# Patient Record
Sex: Female | Born: 1970
Health system: Southern US, Community
[De-identification: ages and names within clinical notes are randomized; demographics above are authoritative.]

## PROBLEM LIST (undated history)

## (undated) DIAGNOSIS — K219 Gastro-esophageal reflux disease without esophagitis: Secondary | ICD-10-CM

## (undated) DIAGNOSIS — R112 Nausea with vomiting, unspecified: Secondary | ICD-10-CM

## (undated) HISTORY — DX: Gastro-esophageal reflux disease without esophagitis: K21.9

---
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
## 1999-01-07 ENCOUNTER — Other Ambulatory Visit: Admission: RE | Admit: 1999-01-07 | Discharge: 1999-01-07 | Payer: Self-pay | Admitting: *Deleted

## 2000-03-22 ENCOUNTER — Other Ambulatory Visit: Admission: RE | Admit: 2000-03-22 | Discharge: 2000-03-22 | Payer: Self-pay | Admitting: *Deleted

## 2001-05-08 ENCOUNTER — Other Ambulatory Visit: Admission: RE | Admit: 2001-05-08 | Discharge: 2001-05-08 | Payer: Self-pay | Admitting: *Deleted

## 2001-10-21 ENCOUNTER — Emergency Department (HOSPITAL_COMMUNITY): Admission: EM | Admit: 2001-10-21 | Discharge: 2001-10-21 | Payer: Self-pay | Admitting: Emergency Medicine

## 2002-05-10 ENCOUNTER — Other Ambulatory Visit: Admission: RE | Admit: 2002-05-10 | Discharge: 2002-05-10 | Payer: Self-pay | Admitting: *Deleted

## 2005-02-22 ENCOUNTER — Other Ambulatory Visit: Admission: RE | Admit: 2005-02-22 | Discharge: 2005-02-22 | Payer: Self-pay | Admitting: Obstetrics & Gynecology

## 2009-06-22 ENCOUNTER — Inpatient Hospital Stay (HOSPITAL_COMMUNITY): Admission: AD | Admit: 2009-06-22 | Discharge: 2009-06-25 | Payer: Self-pay | Admitting: Obstetrics and Gynecology

## 2009-06-23 ENCOUNTER — Encounter (INDEPENDENT_AMBULATORY_CARE_PROVIDER_SITE_OTHER): Payer: Self-pay | Admitting: Obstetrics and Gynecology

## 2010-11-18 LAB — CBC
HCT: 31.8 % — ABNORMAL LOW (ref 36.0–46.0)
Hemoglobin: 10.9 g/dL — ABNORMAL LOW (ref 12.0–15.0)
Hemoglobin: 9.5 g/dL — ABNORMAL LOW (ref 12.0–15.0)
MCHC: 33.6 g/dL (ref 30.0–36.0)
MCV: 99.4 fL (ref 78.0–100.0)
RBC: 2.84 MIL/uL — ABNORMAL LOW (ref 3.87–5.11)
RBC: 3.24 MIL/uL — ABNORMAL LOW (ref 3.87–5.11)
WBC: 13.7 10*3/uL — ABNORMAL HIGH (ref 4.0–10.5)
WBC: 14.7 10*3/uL — ABNORMAL HIGH (ref 4.0–10.5)

## 2011-10-27 ENCOUNTER — Other Ambulatory Visit (HOSPITAL_COMMUNITY): Payer: Self-pay | Admitting: Cardiology

## 2012-02-03 ENCOUNTER — Ambulatory Visit (INDEPENDENT_AMBULATORY_CARE_PROVIDER_SITE_OTHER): Payer: 59 | Admitting: Internal Medicine

## 2012-02-03 VITALS — BP 105/67 | HR 64 | Temp 98.5°F | Resp 16 | Ht 63.0 in | Wt 179.0 lb

## 2012-02-03 DIAGNOSIS — M545 Low back pain, unspecified: Secondary | ICD-10-CM

## 2012-02-03 DIAGNOSIS — M5126 Other intervertebral disc displacement, lumbar region: Secondary | ICD-10-CM

## 2012-02-03 DIAGNOSIS — IMO0002 Reserved for concepts with insufficient information to code with codable children: Secondary | ICD-10-CM

## 2012-02-03 MED ORDER — PREDNISONE 10 MG PO TABS: ORAL_TABLET | ORAL | Status: DC

## 2012-02-03 MED ORDER — HYDROCODONE-ACETAMINOPHEN 5-500 MG PO TABS: 1.0000 | ORAL_TABLET | Freq: Three times a day (TID) | ORAL | Status: AC | PRN

## 2012-02-03 MED ORDER — METHOCARBAMOL 750 MG PO TABS: 750.0000 mg | ORAL_TABLET | Freq: Four times a day (QID) | ORAL | Status: AC

## 2012-02-03 NOTE — Progress Notes (Signed)
  Subjective:    Patient ID: Gloria Torres, female    DOB: 02/14/1971, 41 y.o.   MRN: [id number]  HPI Healthy mother of a 2y/o. Lifted baby in  Tub and injured LB. Has R rad. Into buttock, no weakness, numbness.   Review of Systems neg    Objective:   Physical Exam  Constitutional: She is oriented to person, place, and time.  Musculoskeletal:       Lumbar back: She exhibits tenderness, pain and spasm. She exhibits normal range of motion, no swelling, no edema and no deformity.  Neurological: She is alert and oriented to person, place, and time. She has normal strength and normal reflexes. No cranial nerve deficit or sensory deficit. Gait normal.  Psychiatric: She has a normal mood and affect. Her speech is normal and behavior is normal. Judgment and thought content normal. Cognition and memory are normal.          Assessment & Plan:  LB strain with probable HNP R Back care and rest Meds

## 2012-02-03 NOTE — Patient Instructions (Addendum)

## 2012-02-19 ENCOUNTER — Ambulatory Visit (INDEPENDENT_AMBULATORY_CARE_PROVIDER_SITE_OTHER): Payer: 59 | Admitting: Family Medicine

## 2012-02-19 VITALS — BP 98/58 | HR 68 | Temp 98.4°F | Resp 16 | Ht 63.25 in | Wt 178.2 lb

## 2012-02-19 DIAGNOSIS — H6690 Otitis media, unspecified, unspecified ear: Secondary | ICD-10-CM

## 2012-02-19 MED ORDER — AMOXICILLIN-POT CLAVULANATE 875-125 MG PO TABS: 1.0000 | ORAL_TABLET | Freq: Two times a day (BID) | ORAL | Status: AC

## 2012-02-19 NOTE — Patient Instructions (Addendum)
Otitis Media with Effusion  Otitis media with effusion is the presence of fluid in the middle ear. This is a common problem that often follows ear infections. It may be present for weeks or longer after the infection. Unlike an acute ear infection, otits media with effusion refers only to fluid behind the ear drum and not infection. Children with repeated ear and sinus infections and allergy problems are the most likely to get otitis media with effusion.  CAUSES   The most frequent cause of the fluid buildup is dysfunction of the eustacian tubes. These are the tubes that drain fluid in the ears to the throat.  SYMPTOMS    The main symptom of this condition is hearing loss. As a result, you or your child may:   Listen to the TV at a loud volume.   Not respond to questions.   Ask "what" often when spoken to.   There may be a sensation of fullness or pressure but usually not pain.  DIAGNOSIS    Your caregiver will diagnose this condition by examining you or your child's ears.   Your caregiver may test the pressure in you or your child's ear with a tympanometer.   A hearing test may be conducted if the problem persists.   A caregiver will want to re-evaluate the condition periodically to see if it improves.  TREATMENT    Treatment depends on the duration and the effects of the effusion.   Antibiotics, decongestants, nose drops, and cortisone-type drugs may not be helpful.   Children with persistent ear effusions may have delayed language. Children at risk for developmental delays in hearing, learning, and speech may require referral to a specialist earlier than children not at risk.   You or your child's caregiver may suggest a referral to an Ear, Nose, and Throat (ENT) surgeon for treatment. The following may help restore normal hearing:   Drainage of fluid.   Placement of ear tubes (tympanostomy tubes).   Removal of adenoids (adenoidectomy).  HOME CARE INSTRUCTIONS    Avoid second hand  smoke.   Infants who are breast fed are less likely to have this condition.   Avoid feeding infants while laying flat.   Avoid known environmental allergens.   Be sure to see a caregiver or an ENT specialist for follow up.   Avoid people who are sick.  SEEK MEDICAL CARE IF:    Hearing is not better in 3 months.   Hearing is worse.   Ear pain.   Drainage from the ear.   Dizziness.  Document Released: 09/09/2004 Document Revised: 07/22/2011 Document Reviewed: 12/23/2009  [employer] Patient Information 2012 [employer].

## 2012-02-19 NOTE — Progress Notes (Signed)
@[email or web address] @   Patient ID: Gloria Torres MRN: [id number], DOB: 07/23/1971, 41 y.o. Date of Encounter: 02/19/2012, 10:26 AM  Primary Physician: Tally Due, MD  Chief Complaint:  Chief Complaint  Patient presents with  . Otalgia    left ear started hurting last night and today started to drain    HPI: 41 y.o. year old female presents with 1 day history of otalgia. Symptoms began with nasal congestion, sore throat, and cough. Afebrile. Nasal congestion thick and green/yellow. Cough is  Sounds productive. Ear painful and  feels full, leading to sensation of muffled hearing. No drainage or discharge from affected ear. Has tried OTC cold preps without success. No GI complaints. Appetite good  No sick contacts, recent antibiotics, or recent travels.   Here with   No past medical history on file.   Home Meds: Prior to Admission medications   Medication Sig Start Date End Date Taking? Authorizing Provider  HYDROcodone-acetaminophen (VICODIN) 5-500 MG per tablet Take 1 tablet by mouth every 6 (six) hours as needed.   Yes Historical Provider, MD  amoxicillin-clavulanate (AUGMENTIN) 875-125 MG per tablet Take 1 tablet by mouth 2 (two) times daily. 02/19/12 02/29/12  Elvina Sidle, MD  predniSONE (DELTASONE) 10 MG tablet 60-40-30-20-10 with a 10 day taper po pc for hnp 02/03/12   Jonita Albee, MD    Allergies:  Allergies  Allergen Reactions  . Sulfa Antibiotics Hives    History   Social History  . Marital Status: Single    Spouse Name: N/A    Number of Children: N/A  . Years of Education: N/A   Occupational History  . Not on file.   Social History Main Topics  . Smoking status: Never Smoker   . Smokeless tobacco: Not on file  . Alcohol Use: Not on file  . Drug Use: Not on file  . Sexually Active: Not on file   Other Topics Concern  . Not on file   Social History Narrative  . No narrative on file     Review of Systems: Constitutional: negative for chills, fever,  night sweats or weight changes HEENT: see above Respiratory: negative for hemoptysis, wheezing, or shortness of breath Abdominal: negative for abdominal pain, nausea, vomiting or diarrhea Dermatological: negative for rash Neurologic: negative for headache   Physical Exam: Blood pressure 98/58, pulse 68, temperature 98.4 F (36.9 C), temperature source Oral, resp. rate 16, height 5' 3.25" (1.607 m), weight 178 lb 3.2 oz (80.831 kg), SpO2 98.00%., Body mass index is 31.32 kg/(m^2). General: Well developed, well nourished, in no acute distress. Head: Normocephalic, atraumatic, eyes without discharge, sclera non-icteric, nares are congested. Bilateral auditory canals clear. Left TM erythematous, dull, and bulging with bloody effusion behind. No perforation visualized. Contralateral TM pearly grey with reflective cone of light, no effusion or perforation visualized. Oral cavity moist, dentition normal. Posterior pharynx with post nasal drip and mild erythema. No peritonsillar abscess or tonsillar exudate. Neck: Supple. No thyromegaly. Full ROM. No lymphadenopathy. Lungs: Clear bilaterally to auscultation without wheezes, rales, or rhonchi. Breathing is unlabored.  Heart: RRR with S1 S2. No murmurs, rubs, or gallops appreciated. Abdomen: Soft, non-tender, non-distended with normoactive bowel sounds. No hepatosplenomegaly. No rebound/guarding. No obvious abdominal masses. McBurney's, Rovsing's, Iliopsoas, and table jar all negative. Msk:  Strength and tone normal for age. Extremities: No clubbing or cyanosis. No edema. Neuro: Alert and oriented X 3. Moves all extremities spontaneously. CNII-XII grossly in tact. Psych:  Responds to questions appropriately with a normal  affect.     ASSESSMENT AND PLAN:  41 y.o. year old female with acute otitis media of left ear without perforation. 1. Otitis media  amoxicillin-clavulanate (AUGMENTIN) 875-125 MG per tablet    -Tylenol prn -Rest/fluids -RTC  precautions -RTC 3 days if no improvement  Alonna Buckler, md 02/19/2012 10:26 AM

## 2012-11-15 ENCOUNTER — Ambulatory Visit (INDEPENDENT_AMBULATORY_CARE_PROVIDER_SITE_OTHER): Payer: 59 | Admitting: Physician Assistant

## 2012-11-15 ENCOUNTER — Encounter: Payer: Self-pay | Admitting: Physician Assistant

## 2012-11-15 VITALS — BP 89/58 | HR 67 | Temp 98.9°F | Resp 16 | Ht 62.5 in | Wt 171.2 lb

## 2012-11-15 DIAGNOSIS — Z0000 Encounter for general adult medical examination without abnormal findings: Secondary | ICD-10-CM

## 2012-11-15 DIAGNOSIS — R82998 Other abnormal findings in urine: Secondary | ICD-10-CM

## 2012-11-15 LAB — CBC
HCT: 39.6 % (ref 36.0–46.0)
MCH: 30.5 pg (ref 26.0–34.0)
MCV: 90 fL (ref 78.0–100.0)
Platelets: 265 10*3/uL (ref 150–400)
RDW: 13.5 % (ref 11.5–15.5)
WBC: 6 10*3/uL (ref 4.0–10.5)

## 2012-11-15 LAB — POCT URINALYSIS DIPSTICK
Bilirubin, UA: NEGATIVE
Blood, UA: NEGATIVE
Glucose, UA: NEGATIVE
Spec Grav, UA: 1.015
pH, UA: 7

## 2012-11-15 LAB — POCT UA - MICROSCOPIC ONLY
Casts, Ur, LPF, POC: NEGATIVE
Crystals, Ur, HPF, POC: NEGATIVE

## 2012-11-15 NOTE — Progress Notes (Signed)
[address] [place] [address]   Phone [phone]  Subjective:    Patient ID: Gloria Torres, female    DOB: 1971-06-16, 42 y.o.   MRN: [id number]  HPI  Pt presents to clinic for CPE.  She has not had regular medical care in about 3 years since her daughter was born.  She has gained a significant amount of weight in those 3 years and she really wants to get back on track.  She has lost 18# in several months and feels like she is getting better.  She has a GYN exam scheduled for during the summer.  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  The rash has improved with cream but she is concerned about the diabetes diagnosis.  She is unsure she has it because she has checked her blood sugar and it was normal. She is also concerned because she never has any energy.  She works full time and is a mother of a 75 y/o and really thinks that is the cause but would like to make sure there is not a metabolic reason for her fatigue. Goes to dentist q6 months Saw eye dr last month. Review of Systems  Constitutional: Positive for fatigue.  HENT: Negative.   Eyes: Negative.   Respiratory: Negative.   Cardiovascular: Negative.   Endocrine: Negative.   Genitourinary: Negative.   Musculoskeletal: Negative.   Skin: Positive for rash (has seen dermatology - getting better with medication).  Allergic/Immunologic: Negative.   Neurological: Negative.   Hematological: Negative.   Psychiatric/Behavioral: Negative.        Objective:   Physical Exam  Vitals reviewed. Constitutional: She is oriented to person, place, and time. She appears well-developed and well-nourished.  HENT:  Head: Normocephalic and atraumatic.  Right Ear: External ear normal.  Nose: Nose normal.  Mouth/Throat: Oropharynx is clear and moist. No oropharyngeal exudate.  Eyes: Conjunctivae are normal. Pupils are equal,  round, and reactive to light.  Neck: Neck supple. No thyromegaly present.  Cardiovascular: Normal rate, regular rhythm and normal heart sounds.   Pulmonary/Chest: Effort normal and breath sounds normal.  Abdominal: Soft. Bowel sounds are normal. There is no tenderness.  Lymphadenopathy:    She has no cervical adenopathy.  Neurological: She is alert and oriented to person, place, and time. She has normal reflexes.  Skin: Skin is warm and dry. Rash (patch on left shin of discolored/hyperpigmented skin that is not scaly or red with central clearing.) noted.  Psychiatric: She has a normal mood and affect. Her behavior is normal. Judgment and thought content normal.   Results for orders placed in visit on 11/15/12  GLUCOSE, POCT (MANUAL RESULT ENTRY)      Result Value Range   POC Glucose 81  70 - 99 mg/dl  POCT URINALYSIS DIPSTICK      Result Value Range   Color, UA yellow     Clarity, UA clear     Glucose, UA neg     Bilirubin, UA neg     Ketones, UA neg     Spec Grav, UA 1.015     Blood, UA neg     pH, UA 7.0     Protein, UA neg     Urobilinogen, UA 0.2     Nitrite, UA neg     Leukocytes, UA moderate (2+)    POCT UA -  MICROSCOPIC ONLY      Result Value Range   WBC, Ur, HPF, POC 4-8     RBC, urine, microscopic 0-1     Bacteria, U Microscopic 1+     Mucus, UA nge     Epithelial cells, urine per micros 0-1     Crystals, Ur, HPF, POC neg     Casts, Ur, LPF, POC neg     Yeast, UA neg         Assessment & Plan:  Annual physical exam -Pt's glucose was normal today, I do not believe she has diabetes.  She will RTC if her rash does not resolve for a biopsy.  Plan: CBC, POCT glucose (manual entry), Lipid panel, TSH, Comprehensive metabolic panel, POCT urinalysis dipstick  Leukocytes in urine - Plan: POCT UA - Microscopic Only, Urine culture  She will schedule her 1st screening mammogram. She will continue with her plan for her GYN exam this summer.  As long as her labs are normal  and she is healthy and well I can see patient every 3 years.  She will continue to work on her diet and exercise for weight loss.

## 2012-11-16 LAB — LIPID PANEL
HDL: 40 mg/dL (ref >39)
LDL Cholesterol: 97 mg/dL (ref 0–99)
Triglycerides: 68 mg/dL (ref <150)
VLDL: 14 mg/dL (ref 0–40)

## 2012-11-16 LAB — COMPREHENSIVE METABOLIC PANEL
ALT: 9 U/L (ref 0–35)
AST: 14 U/L (ref 0–37)
Alkaline Phosphatase: 58 U/L (ref 39–117)
BUN: 11 mg/dL (ref 6–23)
Calcium: 9.1 mg/dL (ref 8.4–10.5)
Chloride: 106 mEq/L (ref 96–112)
Creat: 0.82 mg/dL (ref 0.50–1.10)
Potassium: 4.2 mEq/L (ref 3.5–5.3)

## 2012-11-16 LAB — TSH: TSH: 1.637 u[IU]/mL (ref 0.350–4.500)

## 2013-01-15 ENCOUNTER — Ambulatory Visit (INDEPENDENT_AMBULATORY_CARE_PROVIDER_SITE_OTHER): Payer: 59 | Admitting: Physician Assistant

## 2013-01-15 VITALS — BP 101/64 | HR 74 | Temp 98.0°F | Resp 16 | Ht 63.5 in | Wt 166.6 lb

## 2013-01-15 DIAGNOSIS — J069 Acute upper respiratory infection, unspecified: Secondary | ICD-10-CM

## 2013-01-15 DIAGNOSIS — H6690 Otitis media, unspecified, unspecified ear: Secondary | ICD-10-CM

## 2013-01-15 DIAGNOSIS — H6692 Otitis media, unspecified, left ear: Secondary | ICD-10-CM

## 2013-01-15 MED ORDER — AMOXICILLIN 875 MG PO TABS: 875.0000 mg | ORAL_TABLET | Freq: Two times a day (BID) | ORAL | Status: DC

## 2013-01-15 NOTE — Progress Notes (Signed)
  Subjective:    Patient ID: Gloria Torres, female    DOB: [birth year], 42 y.o.   MRN: [id number]  HPI [place] has had nasal congestion, PND and sinus pressure for 10 days.  No cough.  She now has 2 days of left ear fullness, pain, muffled hearing.  No fever or chills, dizziness or HA.  She has had OM multiple times in the past.    Review of Systems As above     Objective:   Physical Exam  Constitutional: She is oriented to person, place, and time. She appears well-developed and well-nourished.  HENT:  Right Ear: Tympanic membrane normal.  Left Ear: No drainage, swelling or tenderness. Tympanic membrane is erythematous and bulging.  Nose: Mucosal edema present. Right sinus exhibits no maxillary sinus tenderness and no frontal sinus tenderness. Left sinus exhibits no maxillary sinus tenderness and no frontal sinus tenderness.  Mouth/Throat: Oropharynx is clear and moist.  Cardiovascular: Normal rate and regular rhythm.   Pulmonary/Chest: Effort normal and breath sounds normal.  Lymphadenopathy:       Head (right side): No preauricular and no posterior auricular adenopathy present.       Head (left side): No preauricular and no posterior auricular adenopathy present.    She has no cervical adenopathy.  Neurological: She is alert and oriented to person, place, and time.  Skin: Skin is warm and dry.       Assessment & Plan:  Acute OM URI  Start Amoxicillin 500 mg samples.  She is instructed to take 2 po bid, # 4 tablets provided. Amoxicillin 875 bid for 10 days sent to [facility]. Ibuprofen and Sudafed for relief.  Recheck 48 hours if no improvement.

## 2014-08-21 ENCOUNTER — Ambulatory Visit (INDEPENDENT_AMBULATORY_CARE_PROVIDER_SITE_OTHER): Payer: 59 | Admitting: Family Medicine

## 2014-08-21 VITALS — BP 92/56 | HR 57 | Temp 97.7°F | Resp 16 | Ht 62.5 in | Wt 156.2 lb

## 2014-08-21 DIAGNOSIS — H66011 Acute suppurative otitis media with spontaneous rupture of ear drum, right ear: Secondary | ICD-10-CM

## 2014-08-21 DIAGNOSIS — H9201 Otalgia, right ear: Secondary | ICD-10-CM

## 2014-08-21 DIAGNOSIS — H6691 Otitis media, unspecified, right ear: Secondary | ICD-10-CM

## 2014-08-21 DIAGNOSIS — H7291 Unspecified perforation of tympanic membrane, right ear: Principal | ICD-10-CM

## 2014-08-21 MED ORDER — AMOXICILLIN-POT CLAVULANATE 875-125 MG PO TABS: 1.0000 | ORAL_TABLET | Freq: Two times a day (BID) | ORAL | Status: AC

## 2014-08-21 MED ORDER — HYDROCODONE-ACETAMINOPHEN 5-325 MG PO TABS: 1.0000 | ORAL_TABLET | Freq: Four times a day (QID) | ORAL | Status: DC | PRN

## 2014-08-21 MED ORDER — OFLOXACIN 0.3 % OT SOLN: 5.0000 [drp] | Freq: Every day | OTIC | Status: DC

## 2014-08-21 NOTE — Patient Instructions (Addendum)
Please return if you feel fever, or hear whistling of the ear.  You should return in 6-8 weeks for recheck of your ear, so we can address if you need to see ear nose and throat.     Otitis Media Otitis media is redness, soreness, and inflammation of the middle ear. Otitis media may be caused by allergies or, most commonly, by infection. Often it occurs as a complication of the common cold. SIGNS AND SYMPTOMS Symptoms of otitis media may include:  Earache.  Fever.  Ringing in your ear.  Headache.  Leakage of fluid from the ear. DIAGNOSIS To diagnose otitis media, your health care provider will examine your ear with an otoscope. This is an instrument that allows your health care provider to see into your ear in order to examine your eardrum. Your health care provider also will ask you questions about your symptoms. TREATMENT  Typically, otitis media resolves on its own within 3-5 days. Your health care provider may prescribe medicine to ease your symptoms of pain. If otitis media does not resolve within 5 days or is recurrent, your health care provider may prescribe antibiotic medicines if he or she suspects that a bacterial infection is the cause. HOME CARE INSTRUCTIONS   If you were prescribed an antibiotic medicine, finish it all even if you start to feel better.  Take medicines only as directed by your health care provider.  Keep all follow-up visits as directed by your health care provider. SEEK MEDICAL CARE IF:  You have otitis media only in one ear, or bleeding from your nose, or both.  You notice a lump on your neck.  You are not getting better in 3-5 days.  You feel worse instead of better. SEEK IMMEDIATE MEDICAL CARE IF:   You have pain that is not controlled with medicine.  You have swelling, redness, or pain around your ear or stiffness in your neck.  You notice that part of your face is paralyzed.  You notice that the bone behind your ear (mastoid) is tender  when you touch it. MAKE SURE YOU:   Understand these instructions.  Will watch your condition.  Will get help right away if you are not doing well or get worse. Document Released: 05/07/2004 Document Revised: 12/17/2013 Document Reviewed: 02/27/2013 [facility] Patient Information 2015 [place], [place]. This information is not intended to replace advice given to you by your health care provider. Make sure you discuss any questions you have with your health care provider.

## 2014-08-21 NOTE — Progress Notes (Signed)
    MRN: [id number] DOB: [birth year]  Subjective:   Chief Complaint  Patient presents with  . Otitis Media    Rt Ear x last night    Gloria Torres is a 44 y.o. female presenting for right ear pain that began last night.  She reports that she had UR symptoms of nasal congestion, sinus pressure, and cough.  She reports no fever, chills, or sorethroat.  This has improved, but the right ear pain gradually became worst last night that woke her from sleep.  She took ibuprofen and a left over amoxillin of boyfriend.  She also tried a hot compress on her right ear for the throbbing.  The next morning as she was getting ready for work, she noted that the pain had improved following a vibrating sound in her right ear and then blood-tinged fluid drained from the ear.  She denies imbalance, dizziness, nausea, or vomiting.  She has noticed some muffled hearing today.    Tasneem has a current medication list which includes the following prescription(s): levonorgestrel.   She is allergic to sulfa antibiotics and erythromycin.  Gloria Torres  has no past medical history on file. Also  has past surgical history that includes Cesarean section and tonsillitis (1992).  ROS As in subjective.  Objective:   Vitals: BP 92/56 mmHg  Pulse 57  Temp(Src) 97.7 F (36.5 C) (Oral)  Resp 16  Ht 5' 2.5" (1.588 m)  Wt 156 lb 3.2 oz (70.852 kg)  BMI 28.10 kg/m2  SpO2 99%  Physical Exam  Constitutional: She is oriented to person, place, and time and well-developed, well-nourished, and in no distress. No distress.  HENT:  Head: Normocephalic and atraumatic.  Right Ear: External ear and ear canal normal.  Left Ear: External ear and ear canal normal.  Right TM erythematous.  Tiny blebs across the superior TM, with bright red bloody base posteriorly to TM consistent with a hemorrhage.  No perforation detected.   Left TM with tympanosclerosis.      Eyes: Pupils are equal, round, and reactive to light.  Neck: Normal range of  motion. Neck supple.  Cardiovascular: Regular rhythm and normal heart sounds.  Exam reveals no gallop and no friction rub.   No murmur heard. Pulmonary/Chest: Effort normal and breath sounds normal. No respiratory distress. She has no wheezes.  Neurological: She is alert and oriented to person, place, and time.  Skin: Skin is warm and dry.  Psychiatric: Mood, memory, affect and judgment normal.    Assessment and Plan :  44 year old female is here today for right ear pain.  Fairly acute otitis media with hx that conveys that there was a perforation that resolved painful ear pressure.  Treating for bacteria.  She will return in 6 weeks for recheck of ear drum and audiometry, where EENT referral may be necessary for this hx of recurrent ear infections.     Middle ear infection with rupture of eardrum, right - Plan: ofloxacin (FLOXIN) 0.3 % otic solution, amoxicillin-clavulanate (AUGMENTIN) 875-125 MG per tablet  Ear pain, right - Plan: HYDROcodone-acetaminophen (NORCO) 5-325 MG per tablet  Ivar Drape, PA-C [facility] 1/6/201611:01 AM

## 2014-08-21 NOTE — Progress Notes (Signed)
As per the note of the physician assistant. I examined the ear also. The TM is extremely red, with a hemorrhagic appearance at the base of the drum. His probably bled behind the drum a little bit. She has some small vesicular-looking areas on the posterior aspect of of the drum. Landmarks are little obscured today. I do not see an active perforation. I agree with treatment plan. Thank you

## 2015-05-08 ENCOUNTER — Other Ambulatory Visit: Payer: Self-pay | Admitting: Obstetrics & Gynecology

## 2015-05-09 LAB — CYTOLOGY - PAP

## 2017-02-22 DIAGNOSIS — Z6825 Body mass index (BMI) 25.0-25.9, adult: Secondary | ICD-10-CM | POA: Diagnosis not present

## 2017-02-22 DIAGNOSIS — Z01419 Encounter for gynecological examination (general) (routine) without abnormal findings: Secondary | ICD-10-CM | POA: Diagnosis not present

## 2017-02-22 DIAGNOSIS — Z1231 Encounter for screening mammogram for malignant neoplasm of breast: Secondary | ICD-10-CM | POA: Diagnosis not present

## 2017-11-23 DIAGNOSIS — L308 Other specified dermatitis: Secondary | ICD-10-CM | POA: Diagnosis not present

## 2017-12-12 DIAGNOSIS — H52221 Regular astigmatism, right eye: Secondary | ICD-10-CM | POA: Diagnosis not present

## 2018-06-06 ENCOUNTER — Ambulatory Visit (INDEPENDENT_AMBULATORY_CARE_PROVIDER_SITE_OTHER): Payer: Self-pay | Admitting: Family Medicine

## 2018-06-06 VITALS — BP 106/72 | HR 58 | Temp 98.3°F | Wt 157.8 lb

## 2018-06-06 DIAGNOSIS — M545 Low back pain, unspecified: Secondary | ICD-10-CM

## 2018-06-06 MED ORDER — MELOXICAM 15 MG PO TABS: 15.0000 mg | ORAL_TABLET | Freq: Every day | ORAL | 0 refills | Status: AC

## 2018-06-06 MED ORDER — TRAMADOL HCL 50 MG PO TABS: 50.0000 mg | ORAL_TABLET | Freq: Two times a day (BID) | ORAL | 0 refills | Status: AC | PRN

## 2018-06-06 MED ORDER — METHOCARBAMOL 500 MG PO TABS: 500.0000 mg | ORAL_TABLET | Freq: Three times a day (TID) | ORAL | 0 refills | Status: DC

## 2018-06-06 NOTE — Progress Notes (Signed)
Gloria Torres is a 47 y.o. female who presents today with concerns of back pain x 2 days with no known history of trauma or injury. Previous historical exacerbation successfully treated with muscle relaxants. She has used Aleve up to this point for the pain and experienced some relief. She denies fever, loss of bowel or bladder control or radiation of pain down her leg.  Review of Systems  Constitutional: Negative for chills, fever and malaise/fatigue.  HENT: Negative for congestion, ear discharge, ear pain, sinus pain and sore throat.   Eyes: Negative.   Respiratory: Negative for cough, sputum production and shortness of breath.   Cardiovascular: Negative.  Negative for chest pain.  Gastrointestinal: Negative for abdominal pain, diarrhea, nausea and vomiting.  Genitourinary: Negative for dysuria, frequency, hematuria and urgency.  Musculoskeletal: Positive for back pain and myalgias.  Skin: Negative.   Neurological: Negative for headaches.  Endo/Heme/Allergies: Negative.   Psychiatric/Behavioral: Negative.     O: Vitals:   06/06/18 1121  BP: 106/72  Pulse: (!) 58  Temp: 98.3 F (36.8 C)  SpO2: 99%     Physical Exam  Constitutional: She is oriented to person, place, and time. Vital signs are normal. She appears well-developed and well-nourished. She is active.  Non-toxic appearance. She does not have a sickly appearance.  HENT:  Head: Normocephalic.  Right Ear: Hearing, tympanic membrane, external ear and ear canal normal.  Left Ear: Hearing, tympanic membrane, external ear and ear canal normal.  Nose: Nose normal.  Mouth/Throat: Uvula is midline and oropharynx is clear and moist.  Neck: Normal range of motion. Neck supple.  Cardiovascular: Normal rate, regular rhythm, normal heart sounds and normal pulses.  Pulmonary/Chest: Effort normal and breath sounds normal.  Abdominal: Soft. Bowel sounds are normal.  Musculoskeletal: Normal range of motion.       Lumbar back: She  exhibits tenderness, pain and spasm. She exhibits normal range of motion, no bony tenderness, no swelling, no edema, no deformity, no laceration and normal pulse.       Back:  Area of pain and discomfort- SLR negative- full but painful ROM- no evidence of edema, deformity or erythema on exam.  Lymphadenopathy:       Head (right side): No submental and no submandibular adenopathy present.       Head (left side): No submental and no submandibular adenopathy present.    She has no cervical adenopathy.  Neurological: She is alert and oriented to person, place, and time.  Psychiatric: She has a normal mood and affect.  Vitals reviewed.  A: 1. Acute left-sided low back pain without sciatica    P: Discussed exam findings, diagnosis etiology and medication use and indications reviewed with patient. Follow- Up and discharge instructions provided. No emergent/urgent issues found on exam.  Patient verbalized understanding of information provided and agrees with plan of care (POC), all questions answered.  1. Acute left-sided low back pain without sciatica  Supportive care and functional mobility- discussed- timeline of acute exacerbation of 4-5 days and total pain time of 6-8 weeks using usual care discussed. LBP red flags reviewed and follow up with PCP if symptoms unimproved recommended. Discussed appropriate medication doses of OTC meds and use of heat, topical agents and warm soaks as adjunct treatment options.  - meloxicam (MOBIC) 15 MG tablet; Take 1 tablet (15 mg total) by mouth daily for 10 days. - traMADol (ULTRAM) 50 MG tablet; Take 1 tablet (50 mg total) by mouth every 12 (twelve) hours as needed  for up to 2 days for moderate pain. - methocarbamol (ROBAXIN) 500 MG tablet; Take 1 tablet (500 mg total) by mouth 3 (three) times daily.

## 2018-06-06 NOTE — Patient Instructions (Addendum)
Back Pain, Adult Many adults have back pain from time to time. Common causes of back pain include:  A strained muscle or ligament.  Wear and tear (degeneration) of the spinal disks.  Arthritis.  A hit to the back.  Back pain can be short-lived (acute) or last a long time (chronic). A physical exam, lab tests, and imaging studies may be done to find the cause of your pain. Follow these instructions at home: Managing pain and stiffness  Take over-the-counter and prescription medicines only as told by your health care provider.  If directed, apply heat to the affected area as often as told by your health care provider. Use the heat source that your health care provider recommends, such as a moist heat pack or a heating pad. ? Place a towel between your skin and the heat source. ? Leave the heat on for 20-30 minutes. ? Remove the heat if your skin turns bright red. This is especially important if you are unable to feel pain, heat, or cold. You have a greater risk of getting burned.  If directed, apply ice to the injured area: ? Put ice in a plastic bag. ? Place a towel between your skin and the bag. ? Leave the ice on for 20 minutes, 2-3 times a day for the first 2-3 days. Activity  Do not stay in bed. Resting more than 1-2 days can delay your recovery.  Take short walks on even surfaces as soon as you are able. Try to increase the length of time you walk each day.  Do not sit, drive, or stand in one place for more than 30 minutes at a time. Sitting or standing for long periods of time can put stress on your back.  Use proper lifting techniques. When you bend and lift, use positions that put less stress on your back: ? Bend your knees. ? Keep the load close to your body. ? Avoid twisting.  Exercise regularly as told by your health care provider. Exercising will help your back heal faster. This also helps prevent back injuries by keeping muscles strong and flexible.  Your health  care provider may recommend that you see a physical therapist. This person can help you come up with a safe exercise program. Do any exercises as told by your physical therapist. Lifestyle  Maintain a healthy weight. Extra weight puts stress on your back and makes it difficult to have good posture.  Avoid activities or situations that make you feel anxious or stressed. Learn ways to manage anxiety and stress. One way to manage stress is through exercise. Stress and anxiety increase muscle tension and can make back pain worse. General instructions  Sleep on a firm mattress in a comfortable position. Try lying on your side with your knees slightly bent. If you lie on your back, put a pillow under your knees.  Follow your treatment plan as told by your health care provider. This may include: ? Cognitive or behavioral therapy. ? Acupuncture or massage therapy. ? Meditation or yoga. Contact a health care provider if:  You have pain that is not relieved with rest or medicine.  You have increasing pain going down into your legs or buttocks.  Your pain does not improve in 2 weeks.  You have pain at night.  You lose weight.  You have a fever or chills. Get help right away if:  You develop new bowel or bladder control problems.  You have unusual weakness or numbness in your arms   or legs.  You develop nausea or vomiting.  You develop abdominal pain.  You feel faint. Summary  Many adults have back pain from time to time. A physical exam, lab tests, and imaging studies may be done to find the cause of your pain.  Use proper lifting techniques. When you bend and lift, use positions that put less stress on your back.  Take over-the-counter and prescription medicines and apply heat or ice as directed by your health care provider. This information is not intended to replace advice given to you by your health care provider. Make sure you discuss any questions you have with your health care  provider. Document Released: 08/02/2005 Document Revised: 09/06/2016 Document Reviewed: 09/06/2016 Elsevier Interactive Patient Education  2018 [place] Ask your health care provider which exercises are safe for you. Do exercises exactly as told by your health care provider and adjust them as directed. It is normal to feel mild stretching, pulling, tightness, or discomfort as you do these exercises, but you should stop right away if you feel sudden pain or your pain gets worse. Do not begin these exercises until told by your health care provider. Stretching and range of motion exercises These exercises warm up your muscles and joints and improve the movement and flexibility of your back. These exercises also help to relieve pain, numbness, and tingling. Exercise A: Lumbar rotation  1. Lie on your back on a firm surface and bend your knees. 2. Straighten your arms out to your sides so each arm forms an "L" shape with a side of your body (a 90 degree angle). 3. Slowly move both of your knees to one side of your body until you feel a stretch in your lower back. Try not to let your shoulders move off of the floor. 4. Hold for __________ seconds. 5. Tense your abdominal muscles and slowly move your knees back to the starting position. 6. Repeat this exercise on the other side of your body. Repeat __________ times. Complete this exercise __________ times a day. Exercise B: Prone extension on elbows  1. Lie on your abdomen on a firm surface. 2. Prop yourself up on your elbows. 3. Use your arms to help lift your chest up until you feel a gentle stretch in your abdomen and your lower back. ? This will place some of your body weight on your elbows. If this is uncomfortable, try stacking pillows under your chest. ? Your hips should stay down, against the surface that you are lying on. Keep your hip and back muscles relaxed. 4. Hold for __________ seconds. 5. Slowly relax  your upper body and return to the starting position. Repeat __________ times. Complete this exercise __________ times a day. Strengthening exercises These exercises build strength and endurance in your back. Endurance is the ability to use your muscles for a long time, even after they get tired. Exercise C: Pelvic tilt 1. Lie on your back on a firm surface. Bend your knees and keep your feet flat. 2. Tense your abdominal muscles. Tip your pelvis up toward the ceiling and flatten your lower back into the floor. ? To help with this exercise, you may place a small towel under your lower back and try to push your back into the towel. 3. Hold for __________ seconds. 4. Let your muscles relax completely before you repeat this exercise. Repeat __________ times. Complete this exercise __________ times a day. Exercise D: Alternating arm and leg raises  1. Get on your hands and knees on a firm surface. If you are on a hard floor, you may want to use padding to cushion your knees, such as an exercise mat. 2. Line up your arms and legs. Your hands should be below your shoulders, and your knees should be below your hips. 3. Lift your left leg behind you. At the same time, raise your right arm and straighten it in front of you. ? Do not lift your leg higher than your hip. ? Do not lift your arm higher than your shoulder. ? Keep your abdominal and back muscles tight. ? Keep your hips facing the ground. ? Do not arch your back. ? Keep your balance carefully, and do not hold your breath. 4. Hold for __________ seconds. 5. Slowly return to the starting position and repeat with your right leg and your left arm. Repeat __________ times. Complete this exercise __________ times a day. Exercise E: Abdominal set with straight leg raise  1. Lie on your back on a firm surface. 2. Bend one of your knees and keep your other leg straight. 3. Tense your abdominal muscles and lift your straight leg up, 4-6 inches  (10-15 cm) off the ground. 4. Keep your abdominal muscles tight and hold for __________ seconds. ? Do not hold your breath. ? Do not arch your back. Keep it flat against the ground. 5. Keep your abdominal muscles tense as you slowly lower your leg back to the starting position. 6. Repeat with your other leg. Repeat __________ times. Complete this exercise __________ times a day. Posture and body mechanics  Body mechanics refers to the movements and positions of your body while you do your daily activities. Posture is part of body mechanics. Good posture and healthy body mechanics can help to relieve stress in your body's tissues and joints. Good posture means that your spine is in its natural S-curve position (your spine is neutral), your shoulders are pulled back slightly, and your head is not tipped forward. The following are general guidelines for applying improved posture and body mechanics to your everyday activities. Standing   When standing, keep your spine neutral and your feet about hip-width apart. Keep a slight bend in your knees. Your ears, shoulders, and hips should line up.  When you do a task in which you stand in one place for a long time, place one foot up on a stable object that is 2-4 inches (5-10 cm) high, such as a footstool. This helps keep your spine neutral. Sitting   When sitting, keep your spine neutral and keep your feet flat on the floor. Use a footrest, if necessary, and keep your thighs parallel to the floor. Avoid rounding your shoulders, and avoid tilting your head forward.  When working at a desk or a computer, keep your desk at a height where your hands are slightly lower than your elbows. Slide your chair under your desk so you are close enough to maintain good posture.  When working at a computer, place your monitor at a height where you are looking straight ahead and you do not have to tilt your head forward or downward to look at the  screen. Resting   When lying down and resting, avoid positions that are most painful for you.  If you have pain with activities such as sitting, bending, stooping, or squatting (flexion-based activities), lie in a position in which your body does not bend very much. For example, avoid curling up on  your side with your arms and knees near your chest (fetal position).  If you have pain with activities such as standing for a long time or reaching with your arms (extension-based activities), lie with your spine in a neutral position and bend your knees slightly. Try the following positions:  Lying on your side with a pillow between your knees.  Lying on your back with a pillow under your knees. Lifting   When lifting objects, keep your feet at least shoulder-width apart and tighten your abdominal muscles.  Bend your knees and hips and keep your spine neutral. It is important to lift using the strength of your legs, not your back. Do not lock your knees straight out.  Always ask for help to lift heavy or awkward objects. This information is not intended to replace advice given to you by your health care provider. Make sure you discuss any questions you have with your health care provider. Document Released: 08/02/2005 Document Revised: 04/08/2016 Document Reviewed: 05/14/2015 Elsevier Interactive Patient Education  [employer].

## 2018-06-08 ENCOUNTER — Telehealth: Payer: Self-pay

## 2018-06-08 NOTE — Telephone Encounter (Signed)
I was no able to contacted the patient. 

## 2018-07-17 ENCOUNTER — Ambulatory Visit (INDEPENDENT_AMBULATORY_CARE_PROVIDER_SITE_OTHER): Payer: 59 | Admitting: Family Medicine

## 2018-07-17 ENCOUNTER — Encounter: Payer: Self-pay | Admitting: Family Medicine

## 2018-07-17 DIAGNOSIS — M545 Low back pain, unspecified: Secondary | ICD-10-CM

## 2018-07-17 DIAGNOSIS — M5416 Radiculopathy, lumbar region: Secondary | ICD-10-CM | POA: Insufficient documentation

## 2018-07-17 MED ORDER — METHOCARBAMOL 500 MG PO TABS: 500.0000 mg | ORAL_TABLET | Freq: Three times a day (TID) | ORAL | 0 refills | Status: DC | PRN

## 2018-07-17 NOTE — Patient Instructions (Addendum)
Thank you for coming in today. Attend PT.  Recheck in 6 weeks.  Return sooner if needed.  OK to take methocarbinol at bedtime as needed.  OK to take ibuprofen or aleve Use heating and TENS unit.   Back Yoga  And Piliates can help.  Look for core stabilizing exercises.     TENS UNIT: This is helpful for muscle pain and spasm.   Search and Purchase a TENS 7000 2nd edition at  www.tenspros.com or www.[employer].com It should be less than $30.     TENS unit instructions: Do not shower or bathe with the unit on Turn the unit off before removing electrodes or batteries If the electrodes lose stickiness add a drop of water to the electrodes after they are disconnected from the unit and place on plastic sheet. If you continued to have difficulty, call the TENS unit company to purchase more electrodes. Do not apply lotion on the skin area prior to use. Make sure the skin is clean and dry as this will help prolong the life of the electrodes. After use, always check skin for unusual red areas, rash or other skin difficulties. If there are any skin problems, does not apply electrodes to the same area. Never remove the electrodes from the unit by pulling the wires. Do not use the TENS unit or electrodes other than as directed. Do not change electrode placement without consultating your therapist or physician. Keep 2 fingers with between each electrode. Wear time ratio is 2:1, on to off times.    For example on for 30 minutes off for 15 minutes and then on for 30 minutes off for 15 minutes     Lumbosacral Strain Lumbosacral strain is an injury that causes pain in the lower back (lumbosacral spine). This injury usually occurs from overstretching the muscles or ligaments along your spine. A strain can affect one or more muscles or cord-like tissues that connect bones to other bones (ligaments). What are the causes? This condition may be caused by:  A hard, direct hit (blow) to the  back.  Excessive stretching of the lower back muscles. This may result from: ? A fall. ? Lifting something heavy. ? Repetitive movements such as bending or crouching.  What increases the risk? The following factors may increase your risk of getting this condition:  Participating in sports or activities that involve: ? A sudden twist of the back. ? Pushing or pulling motions.  Being overweight or obese.  Having poor strength and flexibility, especially tight hamstrings or weak muscles in the back or abdomen.  Having too much of a curve in the lower back.  Having a pelvis that is tilted forward.  What are the signs or symptoms? The main symptom of this condition is pain in the lower back, at the site of the strain. Pain may extend (radiate) down one or both legs. How is this diagnosed? This condition is diagnosed based on:  Your symptoms.  Your medical history.  A physical exam. ? Your health care provider may push on certain areas of your back to determine the source of your pain. ? You may be asked to bend forward, backward, and side to side to assess the severity of your pain and your range of motion.  Imaging tests, such as: ? X-rays. ? MRI.  How is this treated? Treatment for this condition may include:  Putting heat and cold on the affected area.  Medicines to help relieve pain and relax your muscles (muscle relaxants).  NSAIDs to help reduce swelling and discomfort.  When your symptoms improve, it is important to gradually return to your normal routine as soon as possible to reduce pain, avoid stiffness, and avoid loss of muscle strength. Generally, symptoms should improve within 6 weeks of treatment. However, recovery time varies. Follow these instructions at home: Managing pain, stiffness, and swelling   If directed, put ice on the injured area during the first 24 hours after your strain. ? Put ice in a plastic bag. ? Place a towel between your skin and  the bag. ? Leave the ice on for 20 minutes, 2-3 times a day.  If directed, put heat on the affected area as often as told by your health care provider. Use the heat source that your health care provider recommends, such as a moist heat pack or a heating pad. ? Place a towel between your skin and the heat source. ? Leave the heat on for 20-30 minutes. ? Remove the heat if your skin turns bright red. This is especially important if you are unable to feel pain, heat, or cold. You may have a greater risk of getting burned. Activity  Rest and return to your normal activities as told by your health care provider. Ask your health care provider what activities are safe for you.  Avoid activities that take a lot of energy for as long as told by your health care provider. General instructions  Take over-the-counter and prescription medicines only as told by your health care provider.  Donot drive or use heavy machinery while taking prescription pain medicine.  Do not use any products that contain nicotine or tobacco, such as cigarettes and e-cigarettes. If you need help quitting, ask your health care provider.  Keep all follow-up visits as told by your health care provider. This is important. How is this prevented?  Use correct form when playing sports and lifting heavy objects.  Use good posture when sitting and standing.  Maintain a healthy weight.  Sleep on a mattress with medium firmness to support your back.  Be safe and responsible while being active to avoid falls.  Do at least 150 minutes of moderate-intensity exercise each week, such as brisk walking or water aerobics. Try a form of exercise that takes stress off your back, such as swimming or stationary cycling.  Maintain physical fitness, including: ? Strength. ? Flexibility. ? Cardiovascular fitness. ? Endurance. Contact a health care provider if:  Your back pain does not improve after 6 weeks of treatment.  Your  symptoms get worse. Get help right away if:  Your back pain is severe.  You cannot stand or walk.  You have difficulty controlling when you urinate or when you have a bowel movement.  You feel nauseous or you vomit.  Your feet get very cold.  You have numbness, tingling, weakness, or problems using your arms or legs.  You develop any of the following: ? Shortness of breath. ? Dizziness. ? Pain in your legs. ? Weakness in your buttocks or legs. ? Discoloration of the skin on your toes or legs. This information is not intended to replace advice given to you by your health care provider. Make sure you discuss any questions you have with your health care provider. Document Released: 05/12/2005 Document Revised: 02/20/2016 Document Reviewed: 01/04/2016 Elsevier Interactive Patient Education  [employer].

## 2018-07-17 NOTE — Progress Notes (Signed)
JAILEE JAQUEZ is a 47 y.o. female who presents to [facility] today for back pain. 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 Around mid-October Ms. Usman was lifting a patient and felt some back pain afterwards. She continued with her normal routine and working out daily. One day while working out, she felt a lot of pressure in her back. The next day while she was walking at work, she felt a burst in her lower back and almost fell to the floor. She went to an urgent care on Oct 22 and was treated with Meloxicam, Tramadol, and Methocarbamol. Since then, she has not been working out and has been avoiding lifting patients at her job. Yesterday, she felt a sharp pain that felt like a "hot poker" going into her right buttocks. She is feeling somewhat better today, but still not back to normal.   She denies changes in bladder or bowel habits and pain, numbness, or tingling down her leg.    ROS:  No headache, visual changes, nausea, vomiting, diarrhea, constipation, dizziness, abdominal pain, skin rash, fevers, chills, night sweats, weight loss, swollen lymph nodes, body aches, joint swelling, muscle aches, chest pain, shortness of breath, mood changes, visual or auditory hallucinations.    Exam:  BP 115/77   Pulse 64   Ht 5\' 2"  (1.575 m)   Wt 157 lb (71.2 kg)   BMI 28.72 kg/m  General: Well Developed, well nourished, and in no acute distress.  Neuro/Psych: Alert and oriented x3, extra-ocular muscles intact, able to move all 4 extremities, sensation grossly intact. Skin: Warm and dry, no rashes noted.  Respiratory: Not using accessory muscles, speaking in full sentences, trachea midline.  Cardiovascular: Pulses palpable, no extremity edema. Abdomen: Does not appear distended. MSK:  Back:  No tenderness to palpation along midline.  Normal ROM.  Pain was not reproduced with flexion, extension, lateral rotation, toe or heel standing.  Normal hip  flexion and extension.  Negative straight leg test bilaterally. Patellar and Achilles reflexes 2+ bilaterally.  Lower extremity strength is intact. Normal gait.      Assessment and Plan: 47 y.o. female with  Left-sided low back pain: Ms. Stelzner likely has a low back muscle strain. Imagining not necessary at this point as she is not having midline pain.  Plan to use physical therapy as predominant treatment method.   Continue home piliates and yoga as tolerated. Can take Methocarbamol at bedtime and ibuprofen as needed. Recommended using heating pad and TENS unit. Follow-up in 6 weeks or sooner if needed.      Orders Placed This Encounter  Procedures  . Ambulatory referral to Physical Therapy    Referral Priority:   Routine    Referral Type:   Physical Medicine    Referral Reason:   Specialty Services Required    Requested Specialty:   Physical Therapy   Meds ordered this encounter  Medications  . methocarbamol (ROBAXIN) 500 MG tablet    Sig: Take 1 tablet (500 mg total) by mouth every 8 (eight) hours as needed for muscle spasms.    Dispense:  10 tablet    Refill:  0    Historical information moved to improve visibility of documentation.  History reviewed. No pertinent past medical history. Past Surgical History:  Procedure Laterality Date  . CESAREAN SECTION    . tonsillitis  1992   Social History   Tobacco Use  . Smoking status: Never Smoker  .  Smokeless tobacco: Never Used  Substance Use Topics  . Alcohol use: No   family history includes Diabetes in her father; Prostate cancer in her father.  Medications: Current Outpatient Medications  Medication Sig Dispense Refill  . levonorgestrel (MIRENA) 20 MCG/24HR IUD 1 each by Intrauterine route once.    . methocarbamol (ROBAXIN) 500 MG tablet Take 1 tablet (500 mg total) by mouth every 8 (eight) hours as needed for muscle spasms. 10 tablet 0   No current facility-administered medications for this visit.     Allergies  Allergen Reactions  . Sulfa Antibiotics Hives  . Erythromycin Rash      Discussed warning signs or symptoms. Please see discharge instructions. Patient expresses understanding.  I personally was present and performed or re-performed the history, physical exam and medical decision-making activities of this service and have verified that the service and findings are accurately documented in the student's note. ___________________________________________ Lynne Leader M.D., ABFM., CAQSM. Primary Care and Sports Medicine Adjunct Instructor of [employer]

## 2018-07-19 ENCOUNTER — Ambulatory Visit: Payer: 59 | Admitting: Rehabilitative and Restorative Service Providers"

## 2018-07-19 ENCOUNTER — Encounter: Payer: Self-pay | Admitting: Rehabilitative and Restorative Service Providers"

## 2018-07-19 DIAGNOSIS — R29898 Other symptoms and signs involving the musculoskeletal system: Secondary | ICD-10-CM

## 2018-07-19 DIAGNOSIS — M545 Low back pain, unspecified: Secondary | ICD-10-CM

## 2018-07-19 NOTE — Therapy (Signed)
[place], [place], [address] Phone: [phone]   Fax:  [phone]  Physical Therapy Evaluation  Patient Details  Name: Gloria Torres MRN: [id number] Date of Birth: 1971/02/18 Referring Provider (PT): Dr Gloria Torres    Encounter Date: 07/19/2018  PT End of Session - 07/19/18 1225    Visit Number  1    Number of Visits  12    Date for PT Re-Evaluation  08/30/18    PT Start Time  0856    PT Stop Time  0950    PT Time Calculation (min)  54 min    Activity Tolerance  Patient tolerated treatment well       History reviewed. No pertinent past medical history.  Past Surgical History:  Procedure Laterality Date  . CESAREAN SECTION    . tonsillitis  1992    There were no vitals filed for this visit.   Subjective Assessment - 07/19/18 0900    Subjective  Patient reports that she injured her back mid october when moving a patient. She returned to her exercise program after a few weeks and noted recurrent LBP which has persisted on an intermittent basis. She has less pain but notices that she has pain with work activities and lifting as well as sometimes awakening with LBP when sleeping.     Pertinent History  denies any other medical problems     Patient Stated Goals  return to full duty at work and return to [employer] exercise program     Currently in Pain?  Yes    Pain Score  3     Pain Location  Buttocks    Pain Orientation  Right    Pain Descriptors / Indicators  Dull;Sharp   occ sharp    Pain Type  Acute pain    Pain Radiating Towards  LBP into the Rt posterior hip     Pain Onset  More than a month ago    Pain Frequency  Intermittent    Aggravating Factors   work activities; lifting; bendig; reaching; walking prolonged distances     Pain Relieving Factors  rest         [facility] PT Assessment - 07/19/18 0001      Assessment   Medical Diagnosis  LBP; Rt posterior hip pain     Referring  Provider (PT)  Dr Gloria Torres     Onset Date/Surgical Date  05/25/18    Hand Dominance  Right    Next MD Visit  1/20    Prior Therapy  none       Precautions   Precautions  None      Balance Screen   Has the patient fallen in the past 6 months  No    Has the patient had a decrease in activity level because of a fear of falling?   No    Is the patient reluctant to leave their home because of a fear of falling?   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     Leisure  household chores; Clinical research associate workout 5-6 days/wk - has not exercised since injury       Observation/Other Assessments   Focus on Therapeutic Outcomes (FOTO)   44% limitation  Sensation   Additional Comments  WFL's per pt report       AROM   Overall AROM Comments  hip ROM WNL's bilat    AROM Assessment Site  --   tightness with trunk motions   Lumbar Flexion  95%    Lumbar Extension  70%    Lumbar - Right Side Bend  70%    Lumbar - Left Side Bend  70%    Lumbar - Right Rotation  70%    Lumbar - Left Rotation  65% pulling      Strength   Overall Strength Comments  WFL's bilat LE's       Flexibility   Hamstrings  tight Rt >Lt     Quadriceps  tight Rt > Lt     ITB  tight Rt > Lt     Piriformis  tight Rt       Palpation   Spinal mobility  tenderness with CPA L4/5    Palpation comment  muscular tightness Rt piriformis/posterior hip/gluts                 Objective measurements completed on examination: See above findings.      [facility] PT Treatment/Exercise - 07/19/18 0001      Lumbar Exercises: Stretches   Passive Hamstring Stretch  1 rep;30 seconds;Right   supine with strap    Quad Stretch  1 rep;30 seconds;Right   prone with strap    ITB Stretch  1 rep;30 seconds;Right   supine with strap    Piriformis Stretch  3 reps;30 seconds;Right   supine travell varying  angles      Lumbar Exercises: Supine   Other Supine Lumbar Exercises  4 part core 10 sec x 10       Moist Heat Therapy   Number Minutes Moist Heat  15 Minutes    Moist Heat Location  Lumbar Spine;Hip   to Rt      Electrical Stimulation   Electrical Stimulation Location  Rt lumbar spine; Rt posterior hip     Electrical Stimulation Action  IFC    Electrical Stimulation Parameters  to tolerance     Electrical Stimulation Goals  Pain             PT Education - 07/19/18 0932    Education Details  POC HEP     Person(s) Educated  Patient    Methods  Explanation;Demonstration;Tactile cues;Verbal cues;Handout    Comprehension  Verbalized understanding;Returned demonstration;Verbal cues required;Tactile cues required          PT Long Term Goals - 07/19/18 1233      PT LONG TERM GOAL #1   Title  Decrease pain by 80-100% allowing patient to return to all normal work and functional activities without limitations 08/30/18    Time  6    Period  Weeks    Status  New      PT LONG TERM GOAL #2   Title  Increase trunk and LE mobility and ROM to WFL's and painfree 08/30/18    Time  6    Period  Weeks    Status  New      PT LONG TERM GOAL #3   Title  progress with core strengthening and stabilization with patient to tolerated 20-30 min of exercise in the clinic with no pain 08/30/18    Time  6    Period  Weeks    Status  New      PT  LONG TERM GOAL #4   Title  Independent in HEP including return to workout videos 08/30/18    Time  6    Period  Weeks    Status  New      PT LONG TERM GOAL #5   Title  Improve FOTO to </+ 25% limitation 08/30/18    Time  6    Period  Weeks    Status  New             Plan - 07/19/18 1226    Clinical Impression Statement  Gloria Torres presents with LBP radiating into the Rt posterior hip following an incident lifting at work mid October with increase in symptoms following a video workout a few days later. Patient has had persistent pain with  variabe intensity since that time. She has limited hip mobility; muscular tightness to palpation and pain with functional activities, especially at work. She has some difficulty sleeping at night. Patient will benefit from PT to address problems identified and return to normal functional activities without pain.     Clinical Presentation  Stable    Clinical Decision Making  Low    Rehab Potential  Good    PT Frequency  1x / week    PT Duration  6 weeks    PT Treatment/Interventions  Patient/family education;ADLs/Self Care Home Management;Cryotherapy;Electrical Stimulation;Iontophoresis 4mg /ml Dexamethasone;Moist Heat;Ultrasound;Dry needling;Manual techniques;Neuromuscular re-education;Therapeutic activities;Therapeutic exercise    PT Next Visit Plan  review HEP; add manual work for Rt piriformis/hip abductors; progress with core stabilization and strengthening; modalities as indicated     Consulted and Agree with Plan of Care  Patient       Patient will benefit from skilled therapeutic intervention in order to improve the following deficits and impairments:  Postural dysfunction, Improper body mechanics, Increased fascial restricitons, Increased muscle spasms, Hypomobility, Decreased range of motion, Decreased mobility, Decreased activity tolerance  Visit Diagnosis: Acute midline low back pain without sciatica - Plan: PT plan of care cert/re-cert  Other symptoms and signs involving the musculoskeletal system - Plan: PT plan of care cert/re-cert     Problem List Patient Active Problem List   Diagnosis Date Noted  . Acute left-sided low back pain without sciatica 07/17/2018    Celyn Nilda Simmer PT, MPH 07/19/2018, 12:52 PM  [facility] [place], [place], [address] Phone: [phone]   Fax:  [phone]  Name: Gloria Torres MRN: [id number] Date of Birth: 1970-09-03

## 2018-07-19 NOTE — Patient Instructions (Addendum)
Abdominal Bracing With Pelvic Floor (Hook-Lying)    With neutral spine, tighten pelvic floor and abdominals sucking belly button to back bone; tighten muscules in the low back at waist; exhale slowly.  Hold 10 sec Repeat __1-_ times. Do _several __ times a day.   HIP: Hamstrings - Supine  Place strap around foot. Raise leg up, keeping knee straight.  Bend opposite knee to protect back if indicated. Hold 30 seconds. 3 reps per set, 2-3 sets per day  Outer Hip Stretch: Reclined IT Band Stretch (Strap)   Strap around one foot, pull leg across body until you feel a pull or stretch in the outside of your hip, with shoulders on mat. Hold for 30 seconds. Repeat 3 times each leg. 2-3 times/day.  Piriformis Stretch   Lying on back, pull right knee toward opposite shoulder. Hold 30 seconds. Repeat 3 times. Do 2-3 sessions per day.   Quads / HF, Prone KNEE: Quadriceps - Prone    Place strap around ankle. Bring ankle toward buttocks. Press hip into surface. Hold 30 seconds. Repeat 3 times per session. Do 2-3 sessions per day.  TENS UNIT: This is helpful for muscle pain and spasm.   Search and Purchase a TENS 7000 2nd edition at www.tenspros.com. It should be less than $30.     TENS unit instructions: Do not shower or bathe with the unit on Turn the unit off before removing electrodes or batteries If the electrodes lose stickiness add a drop of water to the electrodes after they are disconnected from the unit and place on plastic sheet. If you continued to have difficulty, call the TENS unit company to purchase more electrodes. Do not apply lotion on the skin area prior to use. Make sure the skin is clean and dry as this will help prolong the life of the electrodes. After use, always check skin for unusual red areas, rash or other skin difficulties. If there are any skin problems, does not apply electrodes to the same area. Never remove the electrodes from the unit by pulling the  wires. Do not use the TENS unit or electrodes other than as directed. Do not change electrode placement without consultating your therapist or physician. Keep 2 fingers with between each electrode.  Sleeping on Back  Place pillow under knees. A pillow with cervical support and a roll around waist are also helpful. Copyright  VHI. All rights reserved.  Sleeping on Side Place pillow between knees. Use cervical support under neck and a roll around waist as needed. Copyright  VHI. All rights reserved.   Sleeping on Stomach   If this is the only desirable sleeping position, place pillow under lower legs, and under stomach or chest as needed.  Posture - Sitting   Sit upright, head facing forward. Try using a roll to support lower back. Keep shoulders relaxed, and avoid rounded back. Keep hips level with knees. Avoid crossing legs for long periods. Stand to Sit / Sit to Stand   To sit: Bend knees to lower self onto front edge of chair, then scoot back on seat. To stand: Reverse sequence by placing one foot forward, and scoot to front of seat. Use rocking motion to stand up.   Work Height and Reach  Ideal work height is no more than 2 to 4 inches below elbow level when standing, and at elbow level when sitting. Reaching should be limited to arm's length, with elbows slightly bent.  Bending  Bend at hips and knees, not  back. Keep feet shoulder-width apart.    Posture - Standing   Good posture is important. Avoid slouching and forward head thrust. Maintain curve in low back and align ears over shoul- ders, hips over ankles.  Alternating Positions   Alternate tasks and change positions frequently to reduce fatigue and muscle tension. Take rest breaks. Computer Work   Position work to Programmer, multimedia. Use proper work and seat height. Keep shoulders back and down, wrists straight, and elbows at right angles. Use chair that provides full back support. Add footrest and lumbar roll as  needed.  Getting Into / Out of Car  Lower self onto seat, scoot back, then bring in one leg at a time. Reverse sequence to get out.  Dressing  Lie on back to pull socks or slacks over feet, or sit and bend leg while keeping back straight.    Housework - Sink  Place one foot on ledge of cabinet under sink when standing at sink for prolonged periods.   Pushing / Pulling  Pushing is preferable to pulling. Keep back in proper alignment, and use leg muscles to do the work.  Deep Squat   Squat and lift with both arms held against upper trunk. Tighten stomach muscles without holding breath. Use smooth movements to avoid jerking.  Avoid Twisting   Avoid twisting or bending back. Pivot around using foot movements, and bend at knees if needed when reaching for articles.  Carrying Luggage   Distribute weight evenly on both sides. Use a cart whenever possible. Do not twist trunk. Move body as a unit.   Lifting Principles .Maintain proper posture and head alignment. .Slide object as close as possible before lifting. .Move obstacles out of the way. .Test before lifting; ask for help if too heavy. .Tighten stomach muscles without holding breath. .Use smooth movements; do not jerk. .Use legs to do the work, and pivot with feet. .Distribute the work load symmetrically and close to the center of trunk. .Push instead of pull whenever possible.   Ask For Help   Ask for help and delegate to others when possible. Coordinate your movements when lifting together, and maintain the low back curve.  Log Roll   Lying on back, bend left knee and place left arm across chest. Roll all in one movement to the right. Reverse to roll to the left. Always move as one unit. Housework - Sweeping  Use long-handled equipment to avoid stooping.   Housework - Wiping  Position yourself as close as possible to reach work surface. Avoid straining your back.  Laundry - Unloading Wash   To unload  small items at bottom of washer, lift leg opposite to arm being used to reach.  [place] close to area to be raked. Use arm movements to do the work. Keep back straight and avoid twisting.     Cart  When reaching into cart with one arm, lift opposite leg to keep back straight.   Getting Into / Out of Bed  Lower self to lie down on one side by raising legs and lowering head at the same time. Use arms to assist moving without twisting. Bend both knees to roll onto back if desired. To sit up, start from lying on side, and use same move-ments in reverse. Housework - Vacuuming  Hold the vacuum with arm held at side. Step back and forth to move it, keeping head up. Avoid twisting.   Laundry - IT consultant  so that bending and twisting can be avoided.   Laundry - Unloading Dryer  Squat down to reach into clothes dryer or use a reacher.  Gardening - Weeding / Probation officer or Kneel. Knee pads may be helpful.

## 2018-07-25 ENCOUNTER — Ambulatory Visit: Payer: 59 | Admitting: Rehabilitative and Restorative Service Providers"

## 2018-07-25 ENCOUNTER — Encounter: Payer: Self-pay | Admitting: Rehabilitative and Restorative Service Providers"

## 2018-07-25 DIAGNOSIS — R29898 Other symptoms and signs involving the musculoskeletal system: Secondary | ICD-10-CM

## 2018-07-25 DIAGNOSIS — M545 Low back pain, unspecified: Secondary | ICD-10-CM

## 2018-07-25 NOTE — Therapy (Signed)
[place], [place], [address] Phone: [phone]   Fax:  [phone]  Physical Therapy Treatment  Patient Details  Name: Gloria Torres MRN: [id number] Date of Birth: 18-Jul-1971 Referring Provider (PT): Dr Lynne Leader    Encounter Date: 07/25/2018  PT End of Session - 07/25/18 0845    Visit Number  2    Number of Visits  12    Date for PT Re-Evaluation  08/30/18    PT Start Time  0845    PT Stop Time  0945    PT Time Calculation (min)  60 min    Activity Tolerance  Patient tolerated treatment well       History reviewed. No pertinent past medical history.  Past Surgical History:  Procedure Laterality Date  . CESAREAN SECTION    . tonsillitis  1992    There were no vitals filed for this visit.  Subjective Assessment - 07/25/18 0846    Subjective  Patient has done exercises about once a day. She has not noticed any significant difference in he symptoms. She notices increased symptoms in the Rt butt cheek. She has not noticed any improvement overall.     Currently in Pain?  Yes    Pain Score  4     Pain Location  Buttocks    Pain Orientation  Right    Pain Descriptors / Indicators  Nagging;Aching;Dull   electric/sharp at times         [facility] PT Assessment - 07/25/18 0001      Assessment   Medical Diagnosis  LBP; Rt posterior hip pain     Referring Provider (PT)  Dr Lynne Leader     Onset Date/Surgical Date  05/25/18    Hand Dominance  Right    Next MD Visit  1/20    Prior Therapy  none       Flexibility   Piriformis  tight Rt       Palpation   Palpation comment  muscular tightness Rt piriformis/posterior hip/gluts                    [facility] Adult PT Treatment/Exercise - 07/25/18 0001      Therapeutic Activites    Therapeutic Activities  --   patient to try to avoid ER of LE w/lifting/positions      Lumbar Exercises: Stretches   Passive Hamstring Stretch  Right;3  reps;30 seconds   supine with strap    Quad Stretch  1 rep;30 seconds;Right   prone with strap    ITB Stretch  Right;2 reps;30 seconds   supine with strap    Piriformis Stretch  3 reps;30 seconds;Right   supine travell varying angles    Piriformis Stretch Limitations  repeat stretch after DN       Moist Heat Therapy   Number Minutes Moist Heat  20 Minutes    Moist Heat Location  Lumbar Spine;Hip   to Rt      Electrical Stimulation   Electrical Stimulation Location  Rt lumbar spine; Rt posterior hip     Electrical Stimulation Action  IFC    Electrical Stimulation Parameters  to tolerance    Electrical Stimulation Goals  Pain;Tone      Manual Therapy   Manual therapy comments  pt prone     Soft tissue mobilization  deep tissue work through the Rt posterior hip - piriformis/gfluts     Myofascial Release  posterior hip  Passive ROM  PA glides Rt greater trochanter        Trigger Point Dry Needling - 07/25/18 6948    Consent Given?  Yes    Education Handout Provided  Yes    Muscles Treated Lower Body  --   Rt with estim    Gluteus Maximus Response  Palpable increased muscle length    Piriformis Response  Palpable increased muscle length           PT Education - 07/25/18 0918    Education Details  DN     Person(s) Educated  Patient    Methods  Explanation    Comprehension  Verbalized understanding          PT Long Term Goals - 07/19/18 1233      PT LONG TERM GOAL #1   Title  Decrease pain by 80-100% allowing patient to return to all normal work and functional activities without limitations 08/30/18    Time  6    Period  Weeks    Status  New      PT LONG TERM GOAL #2   Title  Increase trunk and LE mobility and ROM to WFL's and painfree 08/30/18    Time  6    Period  Weeks    Status  New      PT LONG TERM GOAL #3   Title  progress with core strengthening and stabilization with patient to tolerated 20-30 min of exercise in the clinic with no pain 08/30/18     Time  6    Period  Weeks    Status  New      PT LONG TERM GOAL #4   Title  Independent in HEP including return to workout videos 08/30/18    Time  6    Period  Weeks    Status  New      PT LONG TERM GOAL #5   Title  Improve FOTO to </+ 25% limitation 08/30/18    Time  6    Period  Weeks    Status  New            Plan - 07/25/18 0846    Clinical Impression Statement  Gloria Torres reports continued symptoms with minimal to no change. She has done exercises about one time/day. Patient responded well to DN and manual work today with decrease in palpable tightness and report of some increased soreness - improved with modalities.     Rehab Potential  Good    PT Frequency  1x / week    PT Duration  6 weeks    PT Treatment/Interventions  Patient/family education;ADLs/Self Care Home Management;Cryotherapy;Electrical Stimulation;Iontophoresis 4mg /ml Dexamethasone;Moist Heat;Ultrasound;Dry needling;Manual techniques;Neuromuscular re-education;Therapeutic activities;Therapeutic exercise    PT Next Visit Plan  review HEP; add manual work for Rt piriformis/hip abductors; progress with core stabilization and strengthening; modalities as indicated     Consulted and Agree with Plan of Care  Patient       Patient will benefit from skilled therapeutic intervention in order to improve the following deficits and impairments:  Postural dysfunction, Improper body mechanics, Increased fascial restricitons, Increased muscle spasms, Hypomobility, Decreased range of motion, Decreased mobility, Decreased activity tolerance  Visit Diagnosis: Acute midline low back pain without sciatica  Other symptoms and signs involving the musculoskeletal system     Problem List Patient Active Problem List   Diagnosis Date Noted  . Acute left-sided low back pain without sciatica 07/17/2018    Allison Silva P Helene Kelp PT,  MPH  07/25/2018, 9:43 AM  [facility] [place]  [place], [place], [address] Phone: [phone]   Fax:  [phone]  Name: Gloria Torres MRN: [id number] Date of Birth: 1970-12-15

## 2018-07-25 NOTE — Patient Instructions (Signed)

## 2018-08-01 ENCOUNTER — Encounter: Payer: 59 | Admitting: Rehabilitative and Restorative Service Providers"

## 2018-08-03 ENCOUNTER — Ambulatory Visit: Payer: 59 | Admitting: Rehabilitative and Restorative Service Providers"

## 2018-08-03 ENCOUNTER — Encounter: Payer: Self-pay | Admitting: Rehabilitative and Restorative Service Providers"

## 2018-08-03 DIAGNOSIS — R29898 Other symptoms and signs involving the musculoskeletal system: Secondary | ICD-10-CM

## 2018-08-03 DIAGNOSIS — M545 Low back pain, unspecified: Secondary | ICD-10-CM

## 2018-08-03 NOTE — Therapy (Signed)
[place] [place] [address] [place], [place], [address] Phone: [phone]   Fax:  [phone]  Physical Therapy Treatment  Patient Details  Name: Gloria Torres MRN: [id number] Date of Birth: Jan 09, 1971 Referring Provider (PT): Dr Lynne Leader    Encounter Date: 08/03/2018  PT End of Session - 08/03/18 0804    Visit Number  3    Number of Visits  12    Date for PT Re-Evaluation  08/30/18    PT Start Time  0802    PT Stop Time  0900    PT Time Calculation (min)  58 min    Activity Tolerance  Patient tolerated treatment well       History reviewed. No pertinent past medical history.  Past Surgical History:  Procedure Laterality Date  . CESAREAN SECTION    . tonsillitis  1992    There were no vitals filed for this visit.  Subjective Assessment - 08/03/18 0804    Subjective  Patient reports some improvement. She has less pain in the buttock/glut. Rt glut/piriformis is a lot better. She notices "pinching" in the spine seems more noticable.  When she is lying flat and lift Lt leg pain shoots down the Rt leg.     Currently in Pain?  No/denies         [facility] PT Assessment - 08/03/18 0001      Assessment   Medical Diagnosis  LBP; Rt posterior hip pain     Referring Provider (PT)  Dr Lynne Leader     Onset Date/Surgical Date  05/25/18    Hand Dominance  Right    Next MD Visit  1/20    Prior Therapy  none       Flexibility   Piriformis  decreasing tightness       Palpation   Palpation comment  persistent muscular tightness Rt piriformis/posterior hip/gluts                    [facility] Adult PT Treatment/Exercise - 08/03/18 0001      Lumbar Exercises: Aerobic   Nustep  L5 x 5 min       Lumbar Exercises: Seated   Sit to Stand  10 reps   core tight      Lumbar Exercises: Supine   Clam  10 reps   green TB alternating legs    Dead Bug  10 reps   core tight    Bridge  10 reps   core tight   Other  Supine Lumbar Exercises  4 part core 10 sec x 10       Lumbar Exercises: Quadruped   Madcat/Old Horse  5 reps    Other Quadruped Lumbar Exercises  childs pose 20 sec x 3       Moist Heat Therapy   Number Minutes Moist Heat  20 Minutes    Moist Heat Location  Lumbar Spine;Hip   to Rt      Electrical Stimulation   Electrical Stimulation Location  Rt lumbar spine; Rt posterior hip     Electrical Stimulation Action  IFC    Electrical Stimulation Parameters  to tolerance    Electrical Stimulation Goals  Pain;Tone      Manual Therapy   Manual therapy comments  pt prone     Soft tissue mobilization  deep tissue work through the Rt posterior hip - piriformis/gfluts     Myofascial Release  Ql lumbar paraspinals  Passive ROM  PA glides Rt greater trochanter        Trigger Point Dry Needling - 08/03/18 0842    Consent Given?  Yes    Muscles Treated Lower Body  --   Rt QL with estim    Longissimus Response  Palpable increased muscle length           PT Education - 08/03/18 0838    Education Details  HEP    Person(s) Educated  Patient    Methods  Explanation;Demonstration;Tactile cues;Verbal cues;Handout    Comprehension  Verbalized understanding;Returned demonstration;Verbal cues required;Tactile cues required          PT Long Term Goals - 07/19/18 1233      PT LONG TERM GOAL #1   Title  Decrease pain by 80-100% allowing patient to return to all normal work and functional activities without limitations 08/30/18    Time  6    Period  Weeks    Status  New      PT LONG TERM GOAL #2   Title  Increase trunk and LE mobility and ROM to WFL's and painfree 08/30/18    Time  6    Period  Weeks    Status  New      PT LONG TERM GOAL #3   Title  progress with core strengthening and stabilization with patient to tolerated 20-30 min of exercise in the clinic with no pain 08/30/18    Time  6    Period  Weeks    Status  New      PT LONG TERM GOAL #4   Title  Independent in HEP  including return to workout videos 08/30/18    Time  6    Period  Weeks    Status  New      PT LONG TERM GOAL #5   Title  Improve FOTO to </+ 25% limitation 08/30/18    Time  6    Period  Weeks    Status  New            Plan - 08/03/18 0804    Clinical Impression Statement  Continued improvement. Patient added exercise without difficulty. She has not been working on her core stabilization which was a focus of today's exercises. Patient tolerated exercise well. Progressing gradually toward stated goals of therapy.     Rehab Potential  Good    PT Frequency  1x / week    PT Duration  6 weeks    PT Treatment/Interventions  Patient/family education;ADLs/Self Care Home Management;Cryotherapy;Electrical Stimulation;Iontophoresis 4mg /ml Dexamethasone;Moist Heat;Ultrasound;Dry needling;Manual techniques;Neuromuscular re-education;Therapeutic activities;Therapeutic exercise    PT Next Visit Plan  review HEP; add manual work for Rt piriformis/hip abductors; progress with core stabilization and strengthening; modalities as indicated     Consulted and Agree with Plan of Care  Patient       Patient will benefit from skilled therapeutic intervention in order to improve the following deficits and impairments:  Postural dysfunction, Improper body mechanics, Increased fascial restricitons, Increased muscle spasms, Hypomobility, Decreased range of motion, Decreased mobility, Decreased activity tolerance  Visit Diagnosis: Acute midline low back pain without sciatica  Other symptoms and signs involving the musculoskeletal system     Problem List Patient Active Problem List   Diagnosis Date Noted  . Acute left-sided low back pain without sciatica 07/17/2018     Nilda Simmer PT, MPH  08/03/2018, 12:02 PM  [facility] [place],  [place], [address] Phone: [phone]   Fax:  [phone]  Name: Gloria Torres MRN:  [id number] Date of Birth: 1970-10-20

## 2018-08-03 NOTE — Patient Instructions (Addendum)
Cat / Cow Flow    Inhale, press spine toward ceiling like a Halloween cat. Keeping strength in arms and abdominals, exhale to soften spine through neutral and into cow pose. Open chest and arch back. Initiate movement between cat and cow at tailbone, one vertebrae at a time. Repeat __5__ times.   BACK: Child's Pose (Sciatica)    Sit in knee-chest position and reach arms forward. Separate knees for comfort. Hold position for _20-30 sec . Repeat _3__ times. Do _2__ times per day.   Combination (Hook-Lying)    Tighten core and slowly raise left leg and lower opposite arm over head. Keep trunk rigid. Repeat _10___ times per set. Do _1-3___ sets per session. Do __1__ sessions per day.    Bridging    Slowly raise buttocks from floor, keeping stomach tight. Repeat _10___ times per set. Do __1-3__ sets per session. Do __1__ sessions per day.    Strengthening: Hip Abductor - Resisted    With band looped around both legs above knees, push one knee out to side holding opposite knee still; repeat with opposite knee alternating legs  Repeat __10__ times per set. Do __1-3__ sets per session. Do __1__ sessions per day.   SIT TO STAND: No Device    Sit with feet shoulder-width apart, on floor. Tighten core. Lean chest forward, raise hips up from surface. Straighten hips and knees. Weight bear equally on left and right sides. __10_ reps per set, _1-3__ sets per day, _2-3__times per day

## 2018-08-15 ENCOUNTER — Encounter: Payer: Self-pay | Admitting: Physical Therapy

## 2018-08-15 ENCOUNTER — Ambulatory Visit: Payer: 59 | Admitting: Physical Therapy

## 2018-08-15 DIAGNOSIS — M545 Low back pain, unspecified: Secondary | ICD-10-CM

## 2018-08-15 DIAGNOSIS — R29898 Other symptoms and signs involving the musculoskeletal system: Secondary | ICD-10-CM | POA: Diagnosis not present

## 2018-08-15 NOTE — Therapy (Addendum)
[place] [address] [address] [place], [place], [address] Phone: [phone]   Fax:  [phone]  Physical Therapy Treatment  Patient Details  Name: Gloria Torres MRN: [id number] Date of Birth: 08-24-1970 Referring Provider (PT): Dr Lynne Leader    Encounter Date: 08/15/2018  PT End of Session - 08/15/18 0856    Visit Number  4    Number of Visits  12    Date for PT Re-Evaluation  08/30/18    PT Start Time  0849    PT Stop Time  0928    PT Time Calculation (min)  39 min       History reviewed. No pertinent past medical history.  Past Surgical History:  Procedure Laterality Date  . CESAREAN SECTION    . tonsillitis  1992    There were no vitals filed for this visit.  Subjective Assessment - 08/15/18 0859    Subjective  "I'm feeling 1000 times better than when this first started".  Pt reports she no longer has every day pain, but still has pain with work (pulling, pushing, lifting pt's) up to 7/10.  She is stretching at least 1x/day.      Patient Stated Goals  return to full duty at work and return to [employer] exercise program     Currently in Pain?  No/denies    Pain Score  0-No pain         [facility] PT Assessment - 08/15/18 0001      Assessment   Medical Diagnosis  LBP; Rt posterior hip pain     Referring Provider (PT)  Dr Lynne Leader     Onset Date/Surgical Date  05/25/18    Hand Dominance  Right    Next MD Visit  1/20    Prior Therapy  none       Palpation   SI assessment   Rt ASIS lower than Lt. Rt PSIS higher than Lt. Rt iliac crest higher in sitting.          [facility] Adult PT Treatment/Exercise - 08/15/18 0001      Lumbar Exercises: Stretches   Passive Hamstring Stretch  Left;Right;30 seconds;3 reps   seated with straight back   Piriformis Stretch  Right;Left;3 reps;30 seconds   seated     Lumbar Exercises: Aerobic   Nustep  L4-5: 5 min (arms/legs)        Lumbar Exercises: Supine   Ab Set  5  reps   VC and tactile cues to engage.    Bent Knee Raise  10 reps   core engaged; cues for proper form .   Bridge  10 reps;5 seconds   core engaged.      Lumbar Exercises: Prone   Opposite Arm/Leg Raise  --      Modalities   Modalities  --   pt declined; will do at home tonight if needed. painfree     Manual Therapy   Manual Therapy  Muscle Energy Technique    Manual therapy comments  point tender in Rt iliacus and Rt QL.     Muscle Energy Technique  MET to correct elevated Rt ilium (contract relax of QL in Rt sidelying); MET to correct Rt ant rotated ilium in supine with contract/relax of Rt hamstring x 2 sets.                   PT Long Term Goals - 07/19/18 1233      PT LONG  TERM GOAL #1   Title  Decrease pain by 80-100% allowing patient to return to all normal work and functional activities without limitations 08/30/18    Time  6    Period  Weeks    Status  New      PT LONG TERM GOAL #2   Title  Increase trunk and LE mobility and ROM to WFL's and painfree 08/30/18    Time  6    Period  Weeks    Status  New      PT LONG TERM GOAL #3   Title  progress with core strengthening and stabilization with patient to tolerated 20-30 min of exercise in the clinic with no pain 08/30/18    Time  6    Period  Weeks    Status  New      PT LONG TERM GOAL #4   Title  Independent in HEP including return to workout videos 08/30/18    Time  6    Period  Weeks    Status  New      PT LONG TERM GOAL #5   Title  Improve FOTO to </+ 25% limitation 08/30/18    Time  6    Period  Weeks    Status  New            Plan - 08/15/18 1316    Clinical Impression Statement  Some pelvis asymmetry noted; some improvement with MET corrections (tolerated well).  Pt required multiple cues to engage TA muscle properly.  Pt tolerated all exercises well, verbally reviewed exercises not covered today and emphasized importance of TA engagement with work activities to prevent reinjury.  Goals  are ongoing .    Rehab Potential  Good    PT Frequency  1x / week    PT Duration  6 weeks    PT Treatment/Interventions  Patient/family education;ADLs/Self Care Home Management;Cryotherapy;Electrical Stimulation;Iontophoresis 48m/ml Dexamethasone;Moist Heat;Ultrasound;Dry needling;Manual techniques;Neuromuscular re-education;Therapeutic activities;Therapeutic exercise    PT Next Visit Plan  assess alignment of pelvis; continue core stabilization.     Consulted and Agree with Plan of Care  Patient       Patient will benefit from skilled therapeutic intervention in order to improve the following deficits and impairments:  Postural dysfunction, Improper body mechanics, Increased fascial restricitons, Increased muscle spasms, Hypomobility, Decreased range of motion, Decreased mobility, Decreased activity tolerance  Visit Diagnosis: Acute midline low back pain without sciatica  Other symptoms and signs involving the musculoskeletal system     Problem List Patient Active Problem List   Diagnosis Date Noted  . Acute left-sided low back pain without sciatica 07/17/2018   JKerin Perna PTA 08/15/18 1:18 PM  [facility] [place] [address] [address]Phone: [phone]  Fax:  [phone] Name: Gloria BONANNOMRN: [id number] of Birth: 9Jul 06, 1972 PHYSICAL THERAPY DISCHARGE SUMMARY  Visits from Start of Care: 4  Current functional level related to goals / functional outcomes: See last progress note for discharge status - doing well and gradually increasing activity level   Remaining deficits: Continued intermittent pain - usually associated with lifting at work   Education / Equipment: HEP  Plan: Patient agrees to discharge.  Patient goals were partially met. Patient is being discharged due to not returning since the last visit.  ?????    Gloria P. HHelene KelpPT, MPH 08/24/18 9:23 AM

## 2018-08-23 ENCOUNTER — Encounter: Payer: 59 | Admitting: Physical Therapy

## 2018-08-24 ENCOUNTER — Encounter: Payer: Self-pay | Admitting: Family Medicine

## 2018-08-24 ENCOUNTER — Telehealth: Payer: Self-pay | Admitting: Rehabilitative and Restorative Service Providers"

## 2018-08-24 NOTE — Telephone Encounter (Signed)
TC to patient to check on her status and see if she wants to continue with PT. He voice mailbox is full. We will discharge patient from PT - doing well with gradual progression of exercises.   Gloria Torres P. Helene Kelp PT, MPH 08/24/18 9:20 AM

## 2018-08-28 ENCOUNTER — Ambulatory Visit: Payer: 59 | Admitting: Family Medicine

## 2019-02-23 ENCOUNTER — Encounter: Payer: Self-pay | Admitting: Family Medicine

## 2019-02-23 ENCOUNTER — Other Ambulatory Visit: Payer: Self-pay

## 2019-02-23 ENCOUNTER — Ambulatory Visit (INDEPENDENT_AMBULATORY_CARE_PROVIDER_SITE_OTHER): Payer: No Typology Code available for payment source | Admitting: Family Medicine

## 2019-02-23 ENCOUNTER — Telehealth: Payer: Self-pay | Admitting: *Deleted

## 2019-02-23 DIAGNOSIS — Z131 Encounter for screening for diabetes mellitus: Secondary | ICD-10-CM

## 2019-02-23 DIAGNOSIS — R5383 Other fatigue: Secondary | ICD-10-CM

## 2019-02-23 DIAGNOSIS — Z13220 Encounter for screening for lipoid disorders: Secondary | ICD-10-CM | POA: Diagnosis not present

## 2019-02-23 DIAGNOSIS — R0789 Other chest pain: Secondary | ICD-10-CM | POA: Diagnosis not present

## 2019-02-23 DIAGNOSIS — N912 Amenorrhea, unspecified: Secondary | ICD-10-CM

## 2019-02-23 NOTE — Telephone Encounter (Signed)
-----   Message from Caren Macadam, MD sent at 02/23/2019  8:31 AM EDT ----- Please set up bloodwork and CXR appointment for her. I would like these to be done as soon as we can and then have her follow up for physical as soon as we can. Let me know if issues with fitting into my schedule.

## 2019-02-23 NOTE — Telephone Encounter (Signed)
Dr Ethlyn Gallery requested the Gloria Torres be scheduled sooner than 8/26.  I left a message for the Gloria Torres to return my call.

## 2019-02-23 NOTE — Telephone Encounter (Signed)
I called the pt and scheduled a lab appt for 7/17.  CPE already scheduled for 8/26.

## 2019-02-23 NOTE — Progress Notes (Signed)
Virtual Visit via Video Note  I connected with Gloria Torres   on 02/23/19 at  8:00 AM EDT by a video enabled telemedicine application and verified that I am speaking with the correct person using two identifiers.  Location patient: home Location provider:work office Persons participating in the virtual visit: patient, provider  I discussed the limitations of evaluation and management by telemedicine and the availability of in person appointments. The patient expressed understanding and agreed to proceed.   Gloria Torres DOB: Apr 05, 1971 Encounter date: 02/23/2019  This is a 48 y.o. female who presents to establish care. No chief complaint on file.   History of present illness: Hasn't been following regularly with primary provider, but does have gyn she sees regularly.   [facility]   Hasn't had physical in years. States she is a "terrible patient". Feels like she is tired all the time and has noted this for last couple of years. Before COVID started she started to have this heaviness in her chest. Feels like this started in January or February. Has been consistent. Doesn't get better or worse. No shortness of breath or pain. No palpitations. In last couple of weeks has noted that feet are swelling at night. Feels really cold all the time (but has always been like that). Feels "chilled" feeling sometimes, but not having fevers. A couple weeks ago had blood drive at work - but they didn't complete because bp and pulse were too low. 87/57. HR usually states in 50-60 range. Has always had lower bp and lower heart rate.   No issues with headaches, light headedness, dizziness. If she gets up too fast on rare occasion will get light headed.   No skin rashes.   Usually gets about 7 hours of sleep/night. Feels ok to make it through work day. Just feels very fatigued; as if she has fully exerted self without actually having done anything.   Was exercising regularly, but  after hurting back in fall   Has had some back issues; better, but still there. Sometimes feel that this keeps her from exercising because she starts to get more back discomfort. Had heaviness in lower back and then got severe pain (felt like "something exploded") when walking across room at work. No imaging was done for back at that time. Since she was able to get through day to day activities, she didn't follow up.   After having 72 year old daughter felt like weight has increased. Weight has increased with lack of exercise. Last weight 159 on home scale.   Mirena has been out for 2 years. Periods are regular, monthly, last 3 days. First day is pretty heavy, then lightens up. Changes 4-5 times/day on first day. Minimal clots.   Well rounded diet.   Had bad heartburn when pregnant, but otherwise just when specific triggers and this is well. Does not have regular BM - every 3-4 days. Has always been issue.     History reviewed. No pertinent past medical history. Past Surgical History:  Procedure Laterality Date  . CESAREAN SECTION    . TONSILLECTOMY  1992   Allergies  Allergen Reactions  . Sulfa Antibiotics Hives  . Erythromycin Rash   No outpatient medications have been marked as taking for the 02/23/19 encounter (Office Visit) with Caren Macadam, MD.   Social History   Tobacco Use  . Smoking status: Never Smoker  . Smokeless tobacco: Never Used  Substance Use Topics  .  Alcohol use: Yes    Comment: rare   Family History  Problem Relation Age of Onset  . Memory loss Mother   . Diabetes Father   . Prostate cancer Father   . Asthma Sister   . Healthy Brother   . Colon cancer Maternal Grandmother   . Lung cancer Maternal Grandfather   . Hodgkin's lymphoma Paternal Grandmother   . Heart failure Paternal Grandfather      Review of Systems  Constitutional: Positive for activity change and fatigue. Negative for chills and fever.  HENT: Negative for congestion.    Respiratory: Negative for cough, chest tightness, shortness of breath and wheezing.   Cardiovascular: Negative for chest pain, palpitations and leg swelling.  Endocrine: Positive for polydipsia. Negative for polyphagia and polyuria.  Musculoskeletal: Positive for back pain.  Neurological: Negative for dizziness, light-headedness and headaches.  Psychiatric/Behavioral: Negative for sleep disturbance.    Objective:  There were no vitals taken for this visit.      BP Readings from Last 3 Encounters:  07/17/18 115/77  06/06/18 106/72  08/21/14 (!) 92/56   Wt Readings from Last 3 Encounters:  07/17/18 157 lb (71.2 kg)  06/06/18 157 lb 12.8 oz (71.6 kg)  08/21/14 156 lb 3.2 oz (70.9 kg)    EXAM:  GENERAL: alert, oriented, appears well and in no acute distress  HEENT: atraumatic, conjunctiva clear, no obvious abnormalities on inspection of external nose and ears  NECK: normal movements of the head and neck  LUNGS: on inspection no signs of respiratory distress, breathing rate appears normal, no obvious gross SOB, gasping or wheezing  CV: no obvious cyanosis  MS: moves all visible extremities without noticeable abnormality  PSYCH/NEURO: pleasant and cooperative, no obvious depression or anxiety, speech and thought processing grossly intact  SKIN: no facial or neck skin abnormality  Assessment/Plan  1. Fatigue, unspecified type Uncertain etiology. Will start with some baseline bloodwork and have her follow up for physical for further assessment.  - CBC with Differential/Platelet; Future - Comprehensive metabolic panel; Future - Vitamin B12; Future - VITAMIN D 25 Hydroxy (Vit-D Deficiency, Fractures); Future - IBC + Ferritin; Future - TSH; Future  2. Chest pressure This has been stable since January. No change with activity level and no additional symptoms. We did discuss if worsening she needs to be evaluated. Will discuss further at upcoming visit. - DG Chest 2 View;  Future  3. Lipid screening - Lipid panel; Future  4. Screening for diabetes mellitus - Hemoglobin A1c; Future  5. Amenorrhea LMP 02/16/19 but will get hcg to ensure no pregnancy. - HCG, Qualitative; Future     I discussed the assessment and treatment plan with the patient. The patient was provided an opportunity to ask questions and all were answered. The patient agreed with the plan and demonstrated an understanding of the instructions.   The patient was advised to call back or seek an in-person evaluation if the symptoms worsen or if the condition fails to improve as anticipated.  I provided 28 minutes of non-face-to-face time during this encounter.   Micheline Rough, MD

## 2019-02-23 NOTE — Telephone Encounter (Signed)
Patient called back and spoke with Elmyra Ricks.  Appt rescheduled for 8/3.

## 2019-03-02 ENCOUNTER — Ambulatory Visit (INDEPENDENT_AMBULATORY_CARE_PROVIDER_SITE_OTHER): Payer: No Typology Code available for payment source

## 2019-03-02 ENCOUNTER — Other Ambulatory Visit: Payer: Self-pay

## 2019-03-02 ENCOUNTER — Other Ambulatory Visit (INDEPENDENT_AMBULATORY_CARE_PROVIDER_SITE_OTHER): Payer: No Typology Code available for payment source

## 2019-03-02 ENCOUNTER — Other Ambulatory Visit: Payer: No Typology Code available for payment source

## 2019-03-02 DIAGNOSIS — Z13220 Encounter for screening for lipoid disorders: Secondary | ICD-10-CM | POA: Diagnosis not present

## 2019-03-02 DIAGNOSIS — Z131 Encounter for screening for diabetes mellitus: Secondary | ICD-10-CM | POA: Diagnosis not present

## 2019-03-02 DIAGNOSIS — R5383 Other fatigue: Secondary | ICD-10-CM

## 2019-03-02 DIAGNOSIS — R0789 Other chest pain: Secondary | ICD-10-CM

## 2019-03-02 DIAGNOSIS — N912 Amenorrhea, unspecified: Secondary | ICD-10-CM

## 2019-03-02 LAB — HEMOGLOBIN A1C: Hgb A1c MFr Bld: 5.5 % (ref 4.6–6.5)

## 2019-03-02 LAB — VITAMIN D 25 HYDROXY (VIT D DEFICIENCY, FRACTURES): VITD: 24.78 ng/mL — ABNORMAL LOW (ref 30.00–100.00)

## 2019-03-02 LAB — COMPREHENSIVE METABOLIC PANEL
ALT: 18 U/L (ref 0–35)
AST: 21 U/L (ref 0–37)
Albumin: 4.3 g/dL (ref 3.5–5.2)
Alkaline Phosphatase: 62 U/L (ref 39–117)
BUN: 14 mg/dL (ref 6–23)
CO2: 28 mEq/L (ref 19–32)
Calcium: 9 mg/dL (ref 8.4–10.5)
Chloride: 104 mEq/L (ref 96–112)
Creatinine, Ser: 0.82 mg/dL (ref 0.40–1.20)
GFR: 74.77 mL/min (ref >60.00)
Glucose, Bld: 84 mg/dL (ref 70–99)
Potassium: 4.3 mEq/L (ref 3.5–5.1)
Sodium: 138 mEq/L (ref 135–145)
Total Bilirubin: 0.4 mg/dL (ref 0.2–1.2)
Total Protein: 6.5 g/dL (ref 6.0–8.3)

## 2019-03-02 LAB — IBC + FERRITIN
Ferritin: 50.9 ng/mL (ref 10.0–291.0)
Iron: 65 ug/dL (ref 42–145)
Saturation Ratios: 20.4 % (ref 20.0–50.0)
Transferrin: 228 mg/dL (ref 212.0–360.0)

## 2019-03-02 LAB — CBC WITH DIFFERENTIAL/PLATELET
Basophils Absolute: 0 10*3/uL (ref 0.0–0.1)
Basophils Relative: 0.2 % (ref 0.0–3.0)
Eosinophils Absolute: 0.1 10*3/uL (ref 0.0–0.7)
Eosinophils Relative: 1.2 % (ref 0.0–5.0)
HCT: 40 % (ref 36.0–46.0)
Hemoglobin: 13.4 g/dL (ref 12.0–15.0)
Lymphocytes Relative: 31.8 % (ref 12.0–46.0)
Lymphs Abs: 2 10*3/uL (ref 0.7–4.0)
MCHC: 33.4 g/dL (ref 30.0–36.0)
MCV: 96.1 fl (ref 78.0–100.0)
Monocytes Absolute: 0.6 10*3/uL (ref 0.1–1.0)
Monocytes Relative: 9 % (ref 3.0–12.0)
Neutro Abs: 3.6 10*3/uL (ref 1.4–7.7)
Neutrophils Relative %: 57.8 % (ref 43.0–77.0)
Platelets: 231 10*3/uL (ref 150.0–400.0)
RBC: 4.16 Mil/uL (ref 3.87–5.11)
RDW: 12.9 % (ref 11.5–15.5)
WBC: 6.3 10*3/uL (ref 4.0–10.5)

## 2019-03-02 LAB — LIPID PANEL
Cholesterol: 190 mg/dL (ref 0–200)
HDL: 53.3 mg/dL (ref >39.00)
LDL Cholesterol: 111 mg/dL — ABNORMAL HIGH (ref 0–99)
NonHDL: 137.18
Total CHOL/HDL Ratio: 4
Triglycerides: 133 mg/dL (ref 0.0–149.0)
VLDL: 26.6 mg/dL (ref 0.0–40.0)

## 2019-03-02 LAB — VITAMIN B12: Vitamin B-12: 388 pg/mL (ref 211–911)

## 2019-03-02 LAB — TSH: TSH: 1.79 u[IU]/mL (ref 0.35–4.50)

## 2019-03-02 IMAGING — DX CHEST - 2 VIEW
2 series · 2 of 2 positions shown · non-contrast
Comparison: None.

CLINICAL DATA: Chest pressure

EXAM:
CHEST - 2 VIEW

[chest pa]
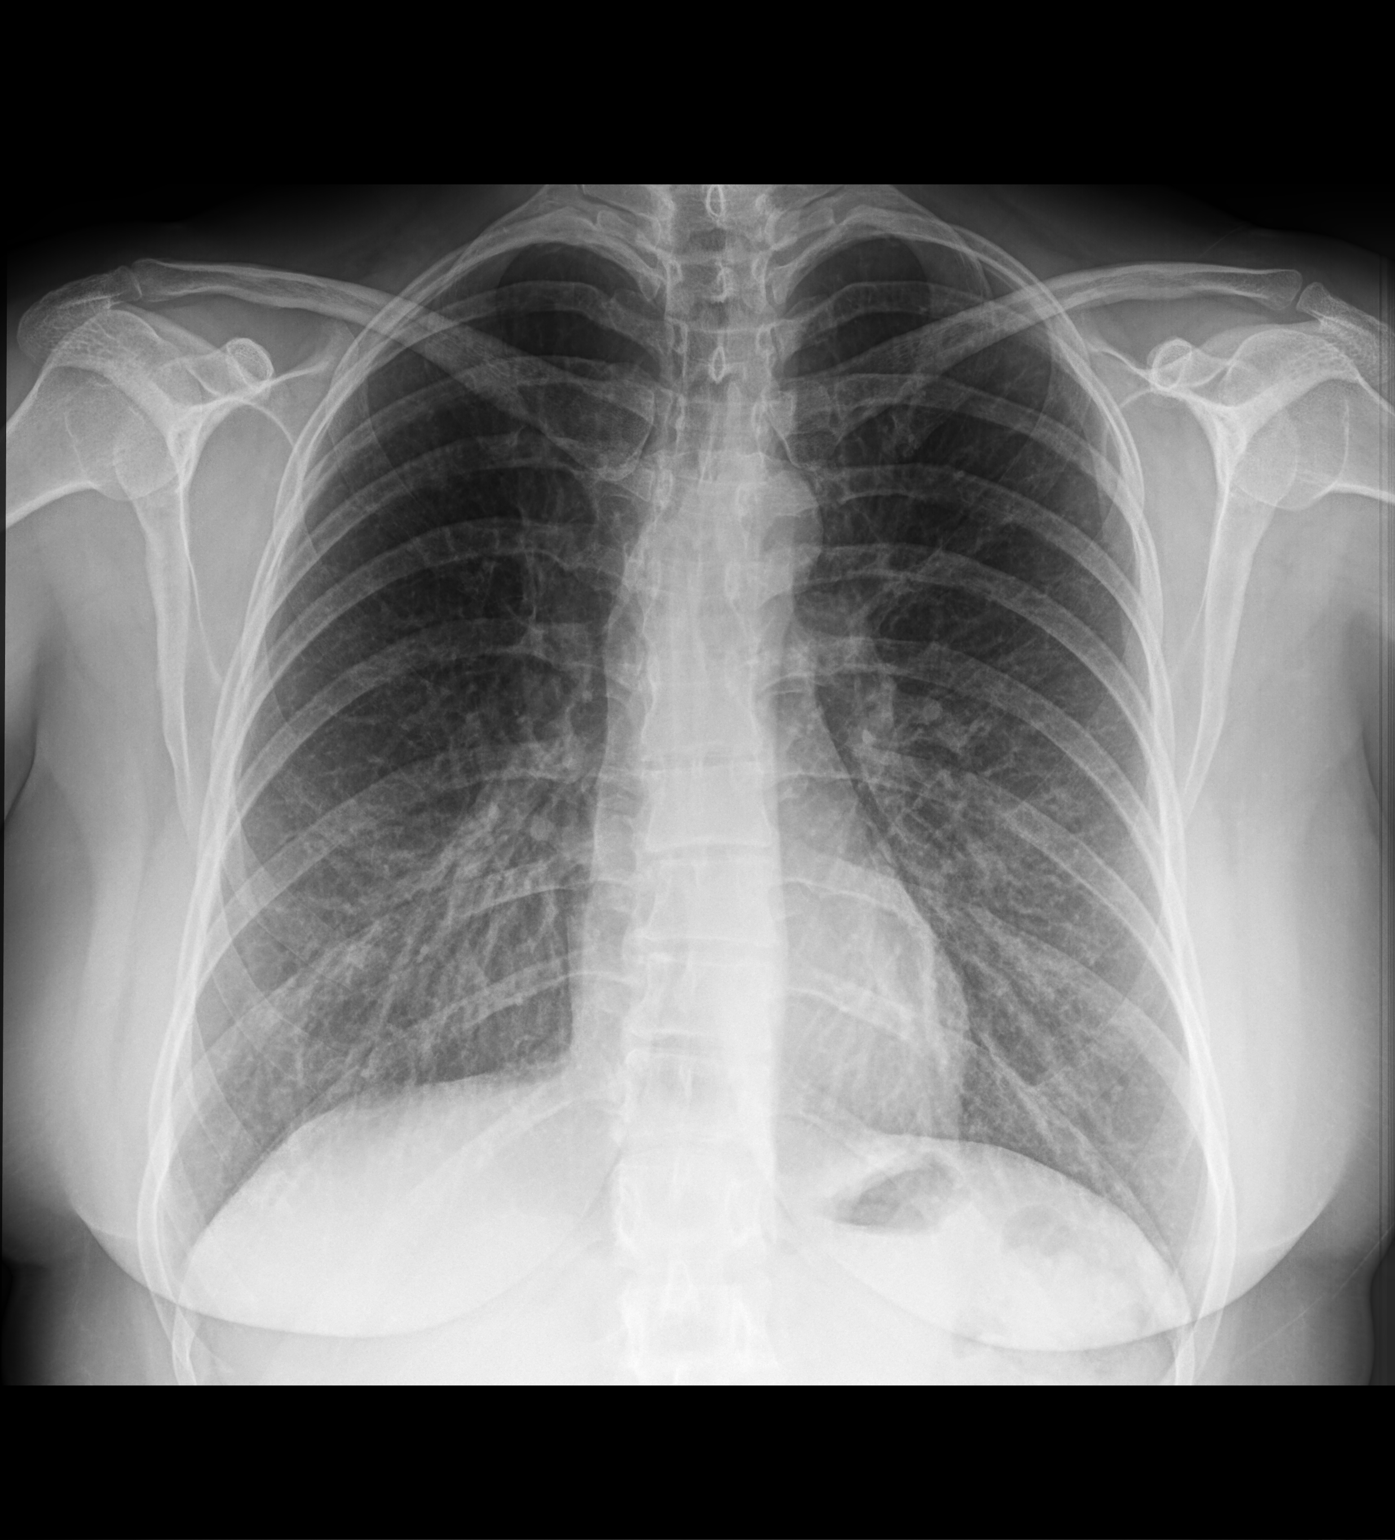

[chest lat]
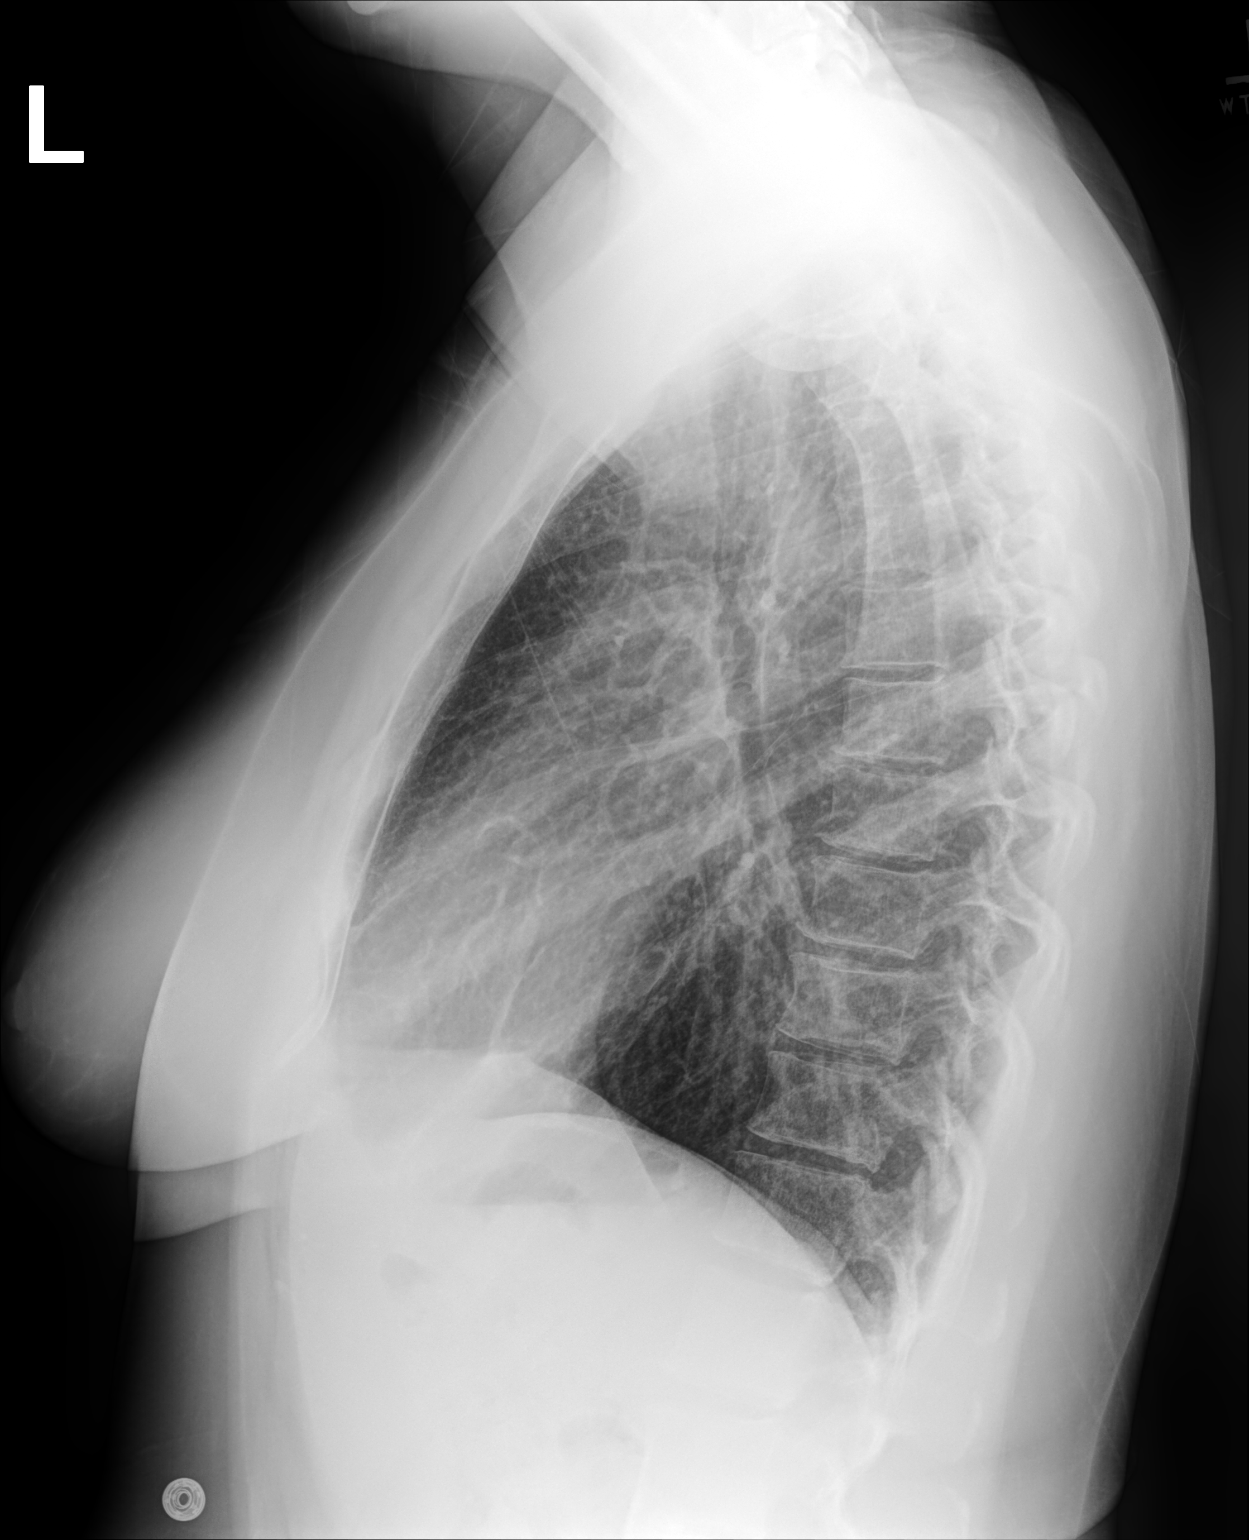

[2 of 2 positions shown; findings below may reference images not displayed]

FINDINGS: Lungs are clear. Heart size and pulmonary vascularity are normal. No
adenopathy. No pneumothorax. No bone lesions.
IMPRESSION: No edema or consolidation.

## 2019-03-03 LAB — HCG, SERUM, QUALITATIVE: Preg, Serum: NEGATIVE

## 2019-03-19 ENCOUNTER — Other Ambulatory Visit: Payer: Self-pay

## 2019-03-19 ENCOUNTER — Ambulatory Visit (INDEPENDENT_AMBULATORY_CARE_PROVIDER_SITE_OTHER): Payer: No Typology Code available for payment source | Admitting: Family Medicine

## 2019-03-19 ENCOUNTER — Encounter: Payer: Self-pay | Admitting: Family Medicine

## 2019-03-19 VITALS — BP 90/50 | HR 64 | Temp 98.6°F | Ht 63.25 in | Wt 164.2 lb

## 2019-03-19 DIAGNOSIS — Z0000 Encounter for general adult medical examination without abnormal findings: Secondary | ICD-10-CM | POA: Diagnosis not present

## 2019-03-19 DIAGNOSIS — R9431 Abnormal electrocardiogram [ECG] [EKG]: Secondary | ICD-10-CM | POA: Diagnosis not present

## 2019-03-19 DIAGNOSIS — R0789 Other chest pain: Secondary | ICD-10-CM | POA: Diagnosis not present

## 2019-03-19 DIAGNOSIS — Z1239 Encounter for other screening for malignant neoplasm of breast: Secondary | ICD-10-CM | POA: Diagnosis not present

## 2019-03-19 NOTE — Progress Notes (Signed)
Gloria Torres DOB: 08/16/1971 Encounter date: 03/19/2019  This is a 48 y.o. female who presents for complete physical   History of present illness/Additional concerns: Still feels some chest pressure or like someone is pushing on chest. Started to note in February, and felt like it progressed for awhile. Now staying pretty stable. Still working through this, not debilitating. Doesn't seem to be affected with exercise.  A couple of days last week didn't feel it but then came back worse in a few days. Sometimes feels like she can't get deep breath, but O2 sats always good. No palpitations. Still does have some swelling in feet. Notes at night more when changing clothes. No change with activity. No changes with changes in position.   Activity just starts to aggravate lower back. Was doing beach body and this will flare up back when she does it.   Has sensation of needing to cough sometimes - notes with deep breath.   History reviewed. No pertinent past medical history. Past Surgical History:  Procedure Laterality Date  . CESAREAN SECTION    . TONSILLECTOMY  1992   Allergies  Allergen Reactions  . Sulfa Antibiotics Hives  . Erythromycin Rash   Current Meds  Medication Sig  . VITAMIN D PO Take 2,000 Units by mouth daily.   Social History   Tobacco Use  . Smoking status: Never Smoker  . Smokeless tobacco: Never Used  Substance Use Topics  . Alcohol use: Yes    Comment: rare   Family History  Problem Relation Age of Onset  . Memory loss Mother   . Diabetes Father   . Prostate cancer Father   . Asthma Sister   . Healthy Brother   . Colon cancer Maternal Grandmother   . Lung cancer Maternal Grandfather   . Hodgkin's lymphoma Paternal Grandmother   . Heart failure Paternal Grandfather      Review of Systems  Constitutional: Negative for activity change, appetite change, chills, fatigue, fever and unexpected weight change.  HENT: Negative for congestion, ear pain, hearing loss,  sinus pressure, sinus pain, sore throat and trouble swallowing.   Eyes: Negative for pain and visual disturbance.  Respiratory: Positive for cough (more with laughing/deep breathing). Negative for chest tightness, shortness of breath and wheezing.   Cardiovascular: Negative for chest pain, palpitations and leg swelling.  Gastrointestinal: Negative for abdominal pain, blood in stool, constipation, diarrhea, nausea and vomiting.  Genitourinary: Negative for difficulty urinating and menstrual problem.  Musculoskeletal: Negative for arthralgias and back pain.  Skin: Negative for rash.  Neurological: Negative for dizziness, weakness, numbness and headaches.  Hematological: Negative for adenopathy. Does not bruise/bleed easily.  Psychiatric/Behavioral: Negative for sleep disturbance and suicidal ideas. The patient is not nervous/anxious.     CBC:  Lab Results  Component Value Date   WBC 6.3 03/02/2019   HGB 13.4 03/02/2019   HCT 40.0 03/02/2019   MCH 30.5 11/15/2012   MCHC 33.4 03/02/2019   RDW 12.9 03/02/2019   PLT 231.0 03/02/2019   CMP: Lab Results  Component Value Date   NA 138 03/02/2019   K 4.3 03/02/2019   CL 104 03/02/2019   CO2 28 03/02/2019   GLUCOSE 84 03/02/2019   BUN 14 03/02/2019   CREATININE 0.82 03/02/2019   CREATININE 0.82 11/15/2012   CALCIUM 9.0 03/02/2019   PROT 6.5 03/02/2019   BILITOT 0.4 03/02/2019   ALKPHOS 62 03/02/2019   ALT 18 03/02/2019   AST 21 03/02/2019   LIPID: Lab Results  Component Value Date   CHOL 190 03/02/2019   TRIG 133.0 03/02/2019   HDL 53.30 03/02/2019   LDLCALC 111 (H) 03/02/2019    Objective:  BP (!) 90/50 (BP Location: Left Arm, Patient Position: Sitting, Cuff Size: Normal)   Pulse 64   Temp 98.6 F (37 C) (Temporal)   Ht 5' 3.25" (1.607 m)   Wt 164 lb 3.2 oz (74.5 kg)   LMP 03/15/2019   SpO2 99%   BMI 28.86 kg/m   Weight: 164 lb 3.2 oz (74.5 kg)   BP Readings from Last 3 Encounters:  03/19/19 (!) 90/50  07/17/18  115/77  06/06/18 106/72   Wt Readings from Last 3 Encounters:  03/19/19 164 lb 3.2 oz (74.5 kg)  07/17/18 157 lb (71.2 kg)  06/06/18 157 lb 12.8 oz (71.6 kg)    Physical Exam Constitutional:      General: She is not in acute distress.    Appearance: She is well-developed.  HENT:     Head: Normocephalic and atraumatic.     Right Ear: External ear normal.     Left Ear: External ear normal.     Mouth/Throat:     Pharynx: No oropharyngeal exudate.  Eyes:     Conjunctiva/sclera: Conjunctivae normal.     Pupils: Pupils are equal, round, and reactive to light.  Neck:     Musculoskeletal: Normal range of motion and neck supple.     Thyroid: No thyromegaly.  Cardiovascular:     Rate and Rhythm: Normal rate and regular rhythm. Occasional extrasystoles are present.    Pulses:          Dorsalis pedis pulses are 2+ on the right side and 2+ on the left side.       Posterior tibial pulses are 2+ on the right side and 2+ on the left side.     Heart sounds: Heart sounds are distant (slightly distant). No murmur. No friction rub. No gallop.      Comments: No current edema; based on pictures she has brought into office she does have trace edema at end of work day.  Pulmonary:     Effort: Pulmonary effort is normal.     Breath sounds: Normal breath sounds.  Abdominal:     General: Bowel sounds are normal. There is no distension.     Palpations: Abdomen is soft. There is no mass.     Tenderness: There is no abdominal tenderness. There is no guarding.     Hernia: No hernia is present.  Musculoskeletal: Normal range of motion.        General: No tenderness or deformity.     Right lower leg: No edema.     Left lower leg: No edema.  Lymphadenopathy:     Cervical: No cervical adenopathy.  Skin:    General: Skin is warm and dry.     Findings: No rash.  Neurological:     Mental Status: She is alert and oriented to person, place, and time.     Deep Tendon Reflexes: Reflexes normal.     Reflex  Scores:      Tricep reflexes are 2+ on the right side and 2+ on the left side.      Bicep reflexes are 2+ on the right side and 2+ on the left side.      Brachioradialis reflexes are 2+ on the right side and 2+ on the left side.      Patellar reflexes are 2+ on the right  side and 2+ on the left side. Psychiatric:        Speech: Speech normal.        Behavior: Behavior normal.        Thought Content: Thought content normal.     Assessment/Plan: Health Maintenance Due  Topic Date Due  . TETANUS/TDAP  04/20/1991  . INFLUENZA VACCINE  03/17/2019   Health Maintenance reviewed. Pap was 2018 through gyn.   1. Preventative health care Goal is to work on healthier eating, regular exercise. Would like to get chest pressure evaluated completely before making further recommendations with exercise.  2. Chest pressure See below.  - EKG 12-Lead - ECHOCARDIOGRAM COMPLETE; Future  3. Screening for breast cancer Ordered today; she will expect call for scheduling and let me know if she doesn't hear about this in 2 weeks time. - MM DIGITAL SCREENING BILATERAL; Future  4. EKG abnormality Low voltage on EKG avL, even on repeat. Otherwise ekg notable for sinus bradycardia. Given normal xray, but persistence of chest pressure will get echo for further evaluation heart structure. Further eval pending this result. - ECHOCARDIOGRAM COMPLETE; Future  Return pending echo.  Micheline Rough, MD

## 2019-03-27 ENCOUNTER — Ambulatory Visit (HOSPITAL_COMMUNITY)
Admission: RE | Admit: 2019-03-27 | Discharge: 2019-03-27 | Disposition: A | Discharge status: Home / Self Care | Payer: No Typology Code available for payment source | Source: Ambulatory Visit | Attending: Family Medicine | Admitting: Family Medicine

## 2019-03-27 ENCOUNTER — Other Ambulatory Visit: Payer: Self-pay

## 2019-03-27 DIAGNOSIS — R0789 Other chest pain: Secondary | ICD-10-CM | POA: Insufficient documentation

## 2019-03-27 DIAGNOSIS — R9431 Abnormal electrocardiogram [ECG] [EKG]: Secondary | ICD-10-CM | POA: Diagnosis not present

## 2019-03-27 NOTE — Progress Notes (Signed)
  Echocardiogram 2D Echocardiogram has been performed.  Gloria Torres 03/27/2019, 10:50 AM

## 2019-03-28 ENCOUNTER — Other Ambulatory Visit: Payer: Self-pay | Admitting: *Deleted

## 2019-03-28 DIAGNOSIS — R0789 Other chest pain: Secondary | ICD-10-CM

## 2019-04-11 ENCOUNTER — Encounter: Payer: Self-pay | Admitting: Family Medicine

## 2019-04-14 ENCOUNTER — Encounter: Payer: Self-pay | Admitting: Family Medicine

## 2019-04-30 NOTE — Progress Notes (Signed)
Cardiology Office Note:    Date:  05/01/2019   ID:  Gloria Torres, DOB Feb 10, 1971, MRN [id number]  PCP:  Caren Macadam, MD  Cardiologist: Ena Dawley, MD  Referring MD: Caren Macadam, MD   Reason for visit: Chest pressure  History of Present Illness:    Gloria Torres is a 48 y.o. female with no prior cardiac history and no significant medical history overall.  The patient works as a Archivist at [employer], she has noticed chest pressure starting in February March, it is not related to exertion, it feels like constant heaviness and inability to take a deep breath, there are no obvious exacerbating or alleviating factors, no relation to food or time of the day, it does not seem to be worse on exertion, or when in supine position.  She denies any significant dyspnea on exertion, he is able to continue working full-time, she denies any lower extremity edema orthopnea or proximal nocturnal dyspnea.  She has never smoked and never had childhood asthma.  There is no family history of premature coronary artery disease, she has never been diagnosed with hyperlipidemia.  She continues to have her period.  She saw her primary care physician in July 2020 we will check her labs that were normal including electrolytes, kidney, liver, thyroid function and hemoglobin.  She had normal chest x-ray at that time.  No past medical history on file.  Past Surgical History:  Procedure Laterality Date  . CESAREAN SECTION    . TONSILLECTOMY  1992    Current Medications: No outpatient medications have been marked as taking for the 05/01/19 encounter (Office Visit) with Dorothy Spark, MD.     Allergies:   Sulfa antibiotics and Erythromycin   Social History   Socioeconomic History  . Marital status: Significant Other    Spouse name: Not on file  . Number of children: 1  . Years of education: Not on file  . Highest education level: Not on file  Occupational History  .  Occupation: CT Engineer, production: [employer]  . Financial resource strain: Not on file  . Food insecurity    Worry: Not on file    Inability: Not on file  . Transportation needs    Medical: Not on file    Non-medical: Not on file  Tobacco Use  . Smoking status: Never Smoker  . Smokeless tobacco: Never Used  Substance and Sexual Activity  . Alcohol use: Yes    Comment: rare  . Drug use: No  . Sexual activity: Yes  Lifestyle  . Physical activity    Days per week: Not on file    Minutes per session: Not on file  . Stress: Not on file  Relationships  . Social Herbalist on phone: Not on file    Gets together: Not on file    Attends religious service: Not on file    Active member of club or organization: Not on file    Attends meetings of clubs or organizations: Not on file    Relationship status: Not on file  Other Topics Concern  . 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     Family History: The patient's family history includes Asthma in her sister; Colon cancer in her maternal grandmother; Diabetes in her father; Healthy in her brother; Heart failure in her paternal grandfather; Hodgkin's lymphoma in  her paternal grandmother; Lung cancer in her maternal grandfather; Memory loss in her mother; Prostate cancer in her father.  ROS:   Please see the history of present illness.    All other systems reviewed and are negative.  EKGs/Labs/Other Studies Reviewed:    The following studies were reviewed today:  TTE: 03/27/2019   1. The left ventricle has normal systolic function, with an ejection fraction of 55-60%. The cavity size was normal. Left ventricular diastolic parameters were normal. No evidence of left ventricular regional wall motion abnormalities.  2. The average left ventricular global longitudinal strain is -20.8 %.  3. The right ventricle has normal systolic function. The cavity was normal. There is no  increase in right ventricular wall thickness. Right ventricular systolic pressure could not be assessed.  4. The aortic valve was not well visualized.  5. The aorta is normal in size and structure.  6. The interatrial septum appears to be lipomatous.  7. Cannot rule out pericardial effusion as study is poor quality.  EKG:  EKG is ordered today.  The ekg ordered today demonstrates normal sinus rhythm, normal EKG, 71 bpm, personally reviewed.  Recent Labs: 03/02/2019: ALT 18; BUN 14; Creatinine, Ser 0.82; Hemoglobin 13.4; Platelets 231.0; Potassium 4.3; Sodium 138; TSH 1.79  Recent Lipid Panel    Component Value Date/Time   CHOL 190 03/02/2019 0815   TRIG 133.0 03/02/2019 0815   HDL 53.30 03/02/2019 0815   CHOLHDL 4 03/02/2019 0815   VLDL 26.6 03/02/2019 0815   LDLCALC 111 (H) 03/02/2019 0815   Physical Exam:    VS:  BP 100/68   Pulse 67   Ht 5' 2.5" (1.588 m)   Wt 170 lb (77.1 kg)   SpO2 99%   BMI 30.60 kg/m     Wt Readings from Last 3 Encounters:  05/01/19 170 lb (77.1 kg)  03/19/19 164 lb 3.2 oz (74.5 kg)  07/17/18 157 lb (71.2 kg)    GEN:  Well nourished, well developed in no acute distress HEENT: Normal NECK: No JVD; No carotid bruits LYMPHATICS: No lymphadenopathy CARDIAC: RRR, no murmurs, rubs, gallops RESPIRATORY:  Clear to auscultation without rales, wheezing or rhonchi  ABDOMEN: Soft, non-tender, non-distended MUSCULOSKELETAL:  No edema; No deformity  SKIN: Warm and dry NEUROLOGIC:  Alert and oriented x 3 PSYCHIATRIC:  Normal affect   ASSESSMENT:    1. EKG abnormality   2. Chest pain, unspecified type   3. Pre-procedure lab exam    PLAN:    In order of problems listed above:  1. Chest pressure -very atypical chest pain that is constant, not related to exertion or food intake or time of a day.  I will obtain coronary CTA to evaluate for coronary artery disease, this will also allow [place] evaluate her mediastinum and lungs. 2. If this is normal, I will  obtain pulmonary function test, in the meantime she is encouraged to try taking Zantac 150 mg p.o. twice daily for 1 week and discontinue if there is no relief in her symptoms.  Medication Adjustments/Labs and Tests Ordered: Current medicines are reviewed at length with the patient today.  Concerns regarding medicines are outlined above.  Orders Placed This Encounter  Procedures  . CT CORONARY FRACTIONAL FLOW RESERVE DATA PREP  . CT CORONARY FRACTIONAL FLOW RESERVE FLUID ANALYSIS  . CT CORONARY MORPH W/CTA COR W/SCORE W/CA W/CM &/OR WO/CM  . Basic Metabolic Panel (BMET)  . EKG 12-Lead   Meds ordered this encounter  Medications  .  metoprolol tartrate (LOPRESSOR) 100 MG tablet    Sig: Take 1 tablet (100 mg total) by mouth once for 1 dose. 2 hours prior to your Ct Scan unless you heart rate is less than 55, if so please bring with you.    Dispense:  1 tablet    Refill:  0    Patient Instructions  Medication Instructions:  Your physician recommends that you continue on your current medications as directed. Please refer to the Current Medication list given to you today.  If you need a refill on your cardiac medications before your next appointment, please call your pharmacy.   Lab work: none If you have labs (blood work) drawn today and your tests are completely normal, you will receive your results only by: Marland Kitchen MyChart Message (if you have MyChart) OR . A paper copy in the mail If you have any lab test that is abnormal or we need to change your treatment, we will call you to review the results.  Testing/Procedures: Your cardiac CT will be scheduled at one of the below locations:   [facility] [address] [place], [place] [address] [phone]  If scheduled at [facility], please arrive at the [facility] main entrance of [facility] 30-45 minutes prior to test start time. Proceed to the [facility] Radiology Department (first floor) to check-in and  test prep.  If scheduled at [facility], please arrive 15 mins early for check-in and test prep.  Please follow these instructions carefully (unless otherwise directed):  Hold all erectile dysfunction medications at least 3 days (72 hrs) prior to test.  On the Night Before the Test: . Be sure to Drink plenty of water. . Do not consume any caffeinated/decaffeinated beverages or chocolate 12 hours prior to your test. . Do not take any antihistamines 12 hours prior to your test.  . On the Day of the Test: . Drink plenty of water. Do not drink any water within one hour of the test. . Do not eat any food 4 hours prior to the test. . You may take your regular medications prior to the test.  . Take metoprolol (Lopressor) two hours prior to test. . FEMALES- please wear underwire-free bra if available   *For Clinical Staff only. Please instruct patient the following:*        -Drink plenty of water       -Take metoprolol (Lopressor) 2 hours prior to test (if applicable).       After the Test: . Drink plenty of water. . After receiving IV contrast, you may experience a mild flushed feeling. This is normal. . On occasion, you may experience a mild rash up to 24 hours after the test. This is not dangerous. If this occurs, you can take Benadryl 25 mg and increase your fluid intake. . If you experience trouble breathing, this can be serious. If it is severe call 911 IMMEDIATELY. If it is mild, please call our office. . If you take any of these medications: Glipizide/Metformin, Avandament, Glucavance, please do not take 48 hours after completing test unless otherwise instructed.    Please contact the cardiac imaging nurse navigator should you have any questions/concerns Marchia Bond, RN Navigator Cardiac Imaging [facility] [phone] Office  [phone] Cell    Follow-Up: At [facility], you and your health needs are our priority.   As part of our continuing mission to provide you with exceptional heart care,  we have created designated Provider Care Teams.  These Care Teams include your primary Cardiologist (physician) and Advanced Practice Providers (APPs -  Physician Assistants and Nurse Practitioners) who all work together to provide you with the care you need, when you need it. You will need a follow up as needed.  You may see Dr. Meda Coffee or one of the following Advanced Practice Providers on your designated Care Team:   [place], PA-C Melina Copa, PA-C . Ermalinda Barrios, PA-C  Any Other Special Instructions Will Be Listed Below (If Applicable).  Labwork prior to your CT no appointment needed. BMET       Signed, Ena Dawley, MD  05/01/2019 4:04 PM    [place]

## 2019-05-01 ENCOUNTER — Telehealth (HOSPITAL_COMMUNITY): Payer: Self-pay | Admitting: Emergency Medicine

## 2019-05-01 ENCOUNTER — Other Ambulatory Visit: Payer: Self-pay

## 2019-05-01 ENCOUNTER — Ambulatory Visit (INDEPENDENT_AMBULATORY_CARE_PROVIDER_SITE_OTHER): Payer: No Typology Code available for payment source | Admitting: Cardiology

## 2019-05-01 ENCOUNTER — Encounter: Payer: Self-pay | Admitting: Cardiology

## 2019-05-01 VITALS — BP 100/68 | HR 67 | Ht 62.5 in | Wt 170.0 lb

## 2019-05-01 DIAGNOSIS — R079 Chest pain, unspecified: Secondary | ICD-10-CM | POA: Diagnosis not present

## 2019-05-01 DIAGNOSIS — R9431 Abnormal electrocardiogram [ECG] [EKG]: Secondary | ICD-10-CM | POA: Diagnosis not present

## 2019-05-01 DIAGNOSIS — Z01812 Encounter for preprocedural laboratory examination: Secondary | ICD-10-CM | POA: Diagnosis not present

## 2019-05-01 MED ORDER — METOPROLOL TARTRATE 100 MG PO TABS: 100.0000 mg | ORAL_TABLET | Freq: Once | ORAL | 0 refills | Status: DC

## 2019-05-01 NOTE — Telephone Encounter (Signed)
Reaching out to patient to offer assistance regarding upcoming cardiac imaging study; pt verbalizes understanding of appt date/time, parking situation and where to check in, pre-test NPO status and medications ordered, and verified current allergies; name and call back number provided for further questions should they arise Rianne Degraaf RN Navigator Cardiac Imaging [facility] [phone] office [phone] cell 

## 2019-05-01 NOTE — Patient Instructions (Addendum)
Medication Instructions:  Your physician recommends that you continue on your current medications as directed. Please refer to the Current Medication list given to you today.  If you need a refill on your cardiac medications before your next appointment, please call your pharmacy.   Lab work: none If you have labs (blood work) drawn today and your tests are completely normal, you will receive your results only by: Marland Kitchen MyChart Message (if you have MyChart) OR . A paper copy in the mail If you have any lab test that is abnormal or we need to change your treatment, we will call you to review the results.  Testing/Procedures: Your cardiac CT will be scheduled at one of the below locations:   [facility] [address] [place], [place] [address] [phone]  If scheduled at [facility], please arrive at the [facility] main entrance of [facility] 30-45 minutes prior to test start time. Proceed to the [facility] Radiology Department (first floor) to check-in and test prep.  If scheduled at [facility], please arrive 15 mins early for check-in and test prep.  Please follow these instructions carefully (unless otherwise directed):  Hold all erectile dysfunction medications at least 3 days (72 hrs) prior to test.  On the Night Before the Test: . Be sure to Drink plenty of water. . Do not consume any caffeinated/decaffeinated beverages or chocolate 12 hours prior to your test. . Do not take any antihistamines 12 hours prior to your test.  . On the Day of the Test: . Drink plenty of water. Do not drink any water within one hour of the test. . Do not eat any food 4 hours prior to the test. . You may take your regular medications prior to the test.  . Take metoprolol (Lopressor) two hours prior to test. . FEMALES- please wear underwire-free bra if available   *For Clinical Staff only. Please instruct patient the following:*         -Drink plenty of water       -Take metoprolol (Lopressor) 2 hours prior to test (if applicable).       After the Test: . Drink plenty of water. . After receiving IV contrast, you may experience a mild flushed feeling. This is normal. . On occasion, you may experience a mild rash up to 24 hours after the test. This is not dangerous. If this occurs, you can take Benadryl 25 mg and increase your fluid intake. . If you experience trouble breathing, this can be serious. If it is severe call 911 IMMEDIATELY. If it is mild, please call our office. . If you take any of these medications: Glipizide/Metformin, Avandament, Glucavance, please do not take 48 hours after completing test unless otherwise instructed.    Please contact the cardiac imaging nurse navigator should you have any questions/concerns Marchia Bond, RN Navigator Cardiac Imaging [facility] [phone] Office  [phone] Cell    Follow-Up: At [facility], you and your health needs are our priority.  As part of our continuing mission to provide you with exceptional heart care, we have created designated Provider Care Teams.  These Care Teams include your primary Cardiologist (physician) and Advanced Practice Providers (APPs -  Physician Assistants and Nurse Practitioners) who all work together to provide you with the care you need, when you need it. You will need a follow up as needed.  You may see Dr. Meda Coffee or one of the following  Advanced Practice Providers on your designated Care Team:   [place], PA-C Melina Copa, PA-C . Ermalinda Barrios, PA-C  Any Other Special Instructions Will Be Listed Below (If Applicable).  Labwork prior to your CT no appointment needed. BMET

## 2019-05-03 ENCOUNTER — Ambulatory Visit (HOSPITAL_COMMUNITY)
Admission: RE | Admit: 2019-05-03 | Discharge: 2019-05-03 | Disposition: A | Discharge status: Home / Self Care | Payer: No Typology Code available for payment source | Source: Ambulatory Visit | Attending: Cardiology | Admitting: Cardiology

## 2019-05-03 ENCOUNTER — Other Ambulatory Visit: Payer: Self-pay

## 2019-05-03 DIAGNOSIS — R079 Chest pain, unspecified: Secondary | ICD-10-CM | POA: Insufficient documentation

## 2019-05-03 IMAGING — CT CT HEART MORP W/ CTA COR W/ SCORE W/ CA W/CM &/OR W/O CM
4 of 7 series · 8 of 20 positions shown, 9 images · IV contrast (APPLIED)
Comparison: None.
COMPARISON: None.

Addendum:
EXAM:
OVER-READ INTERPRETATION  CT CHEST

The following report is an over-read performed by radiologist Dr.
JAMSEER [REDACTED] on [DATE]. This
over-read does not include interpretation of cardiac or coronary
anatomy or pathology. The coronary calcium score/coronary CTA
interpretation by the cardiologist is attached.
CLINICAL DATA: 48-year-old female with atypical chest pain.
Cardiac/Coronary  CTA
TECHNIQUE: The patient was scanned on a [employer] Force scanner.

[Series 6: best diast 72 % · axial · 0.33mm/px · z∈[+1112,+1154]mm · 2 of 323 slices shown, 3 images]
[im 108/323  vessel]
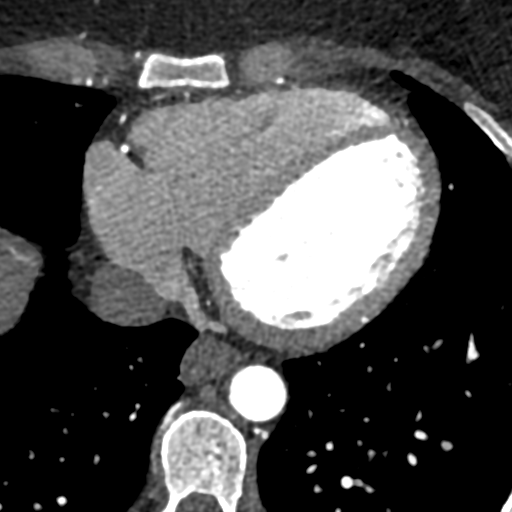
[im 108/323  lung]
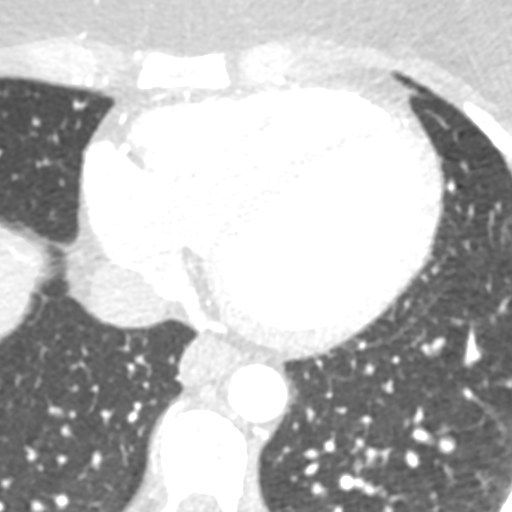
[im 215/323  vessel]
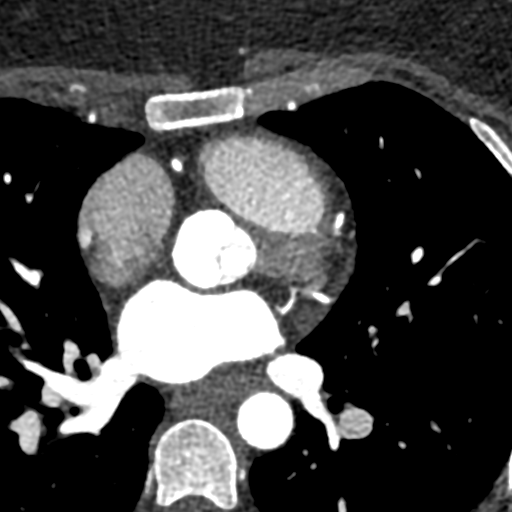

[Series 7: best syst 72 % · axial · 0.33mm/px · z∈[+1112,+1154]mm · 2 of 323 slices shown]
[im 108/323  vessel]
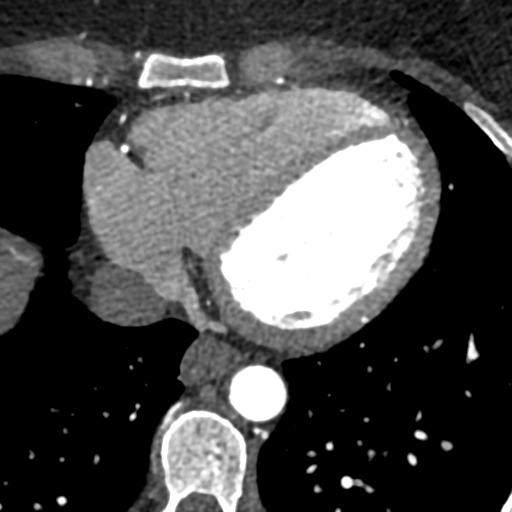
[im 215/323  vessel]
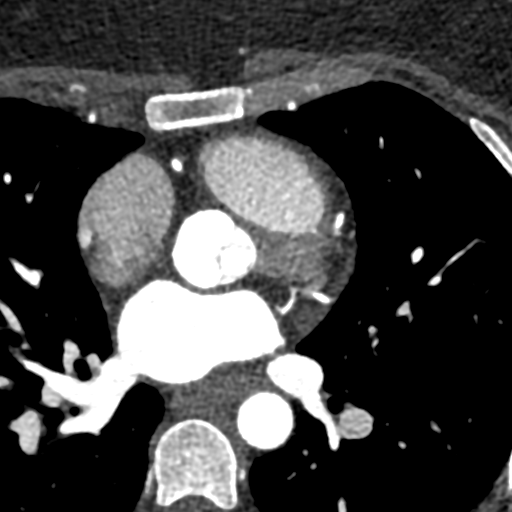

[Series 8: ts diast sharp 72 % · axial · 0.33mm/px · z∈[+1112,+1154]mm · 2 of 323 slices shown]
[im 108/323  lung]
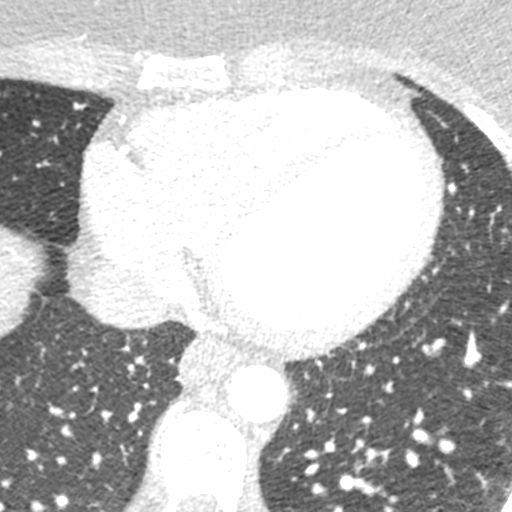
[im 215/323  lung]
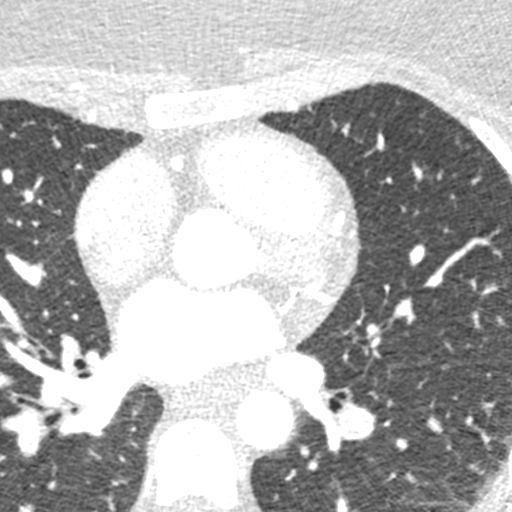

[Series 9: ts syst sharp 72 % · axial · 0.33mm/px · z∈[+1112,+1154]mm · 2 of 323 slices shown]
[im 108/323  lung]
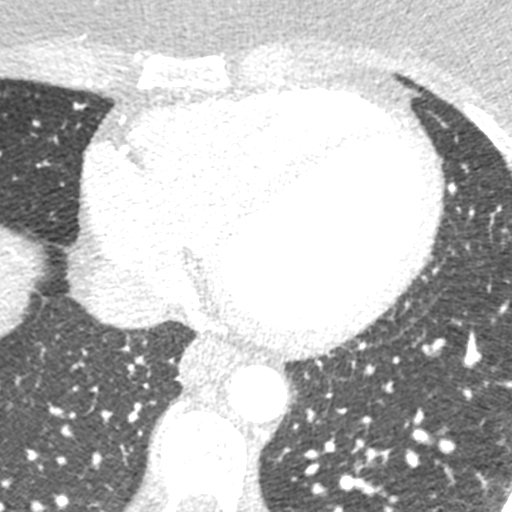
[im 215/323  lung]
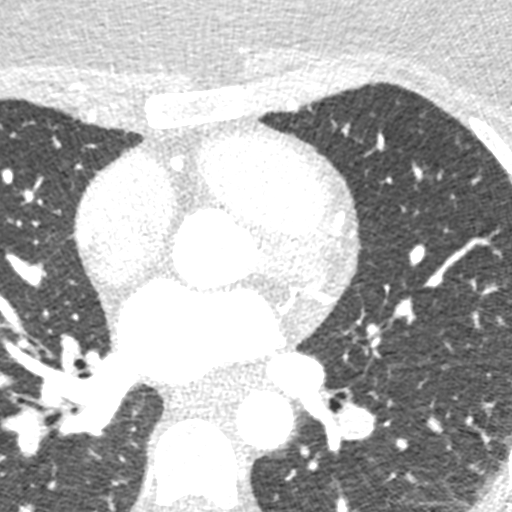

[8 of 20 positions shown; findings below may reference images not displayed]

FINDINGS: Within the visualized portions of the thorax there are no suspicious
appearing pulmonary nodules or masses, there is no acute
consolidative airspace disease, no pleural effusions, no
pneumothorax and no lymphadenopathy. Visualized portions of the
upper abdomen are unremarkable. There are no aggressive appearing
lytic or blastic lesions noted in the visualized portions of the
skeleton.
IMPRESSION: No significant incidental noncardiac findings are noted.
FINDINGS: A 100 kV prospective scan was triggered in the descending thoracic
aorta at 111 HU's. Axial non-contrast 3 mm slices were carried out
through the heart. The data set was analyzed on a dedicated work
station and scored using the Agatson method. Gantry rotation speed
was 250 msecs and collimation was .6 mm. 100 mg of PO Metoprolol and
0.4 mg of sl NTG was given. The 3D data set was reconstructed in 5%
intervals of the 67-82 % of the R-R cycle. Diastolic phases were
analyzed on a dedicated work station using MPR, MIP and VRT modes.
The patient received 80 cc of contrast.

Aorta:  Normal size.  No calcifications.  No dissection.

Aortic Valve:  Trileaflet.  No calcifications.

Coronary Arteries:  Normal coronary origin.  Right dominance.

RCA is a large dominant artery that gives rise to PDA and PLA. There
is no plaque.

Left main is a large artery that gives rise to LAD and LCX arteries.
Left main has no plaque.

LAD is a large vessel that has no plaque.

LCX is a non-dominant artery that gives rise to one large OM1
branch. There is no plaque.

Other findings:

Normal pulmonary vein drainage into the left atrium.

Normal left atrial appendage without a thrombus.

Normal size of the pulmonary artery.
IMPRESSION: 1. Coronary calcium score of 0. This was 0 percentile for age and
sex matched control.

2. Normal coronary origin with right dominance.

3. CAD-RADS 0. No evidence of CAD (0%). Consider non-atherosclerotic
causes of chest pain.

*** End of Addendum ***
EXAM:
OVER-READ INTERPRETATION  CT CHEST

The following report is an over-read performed by radiologist Dr.
JAMSEER [REDACTED] on [DATE]. This
over-read does not include interpretation of cardiac or coronary
anatomy or pathology. The coronary calcium score/coronary CTA
interpretation by the cardiologist is attached.
FINDINGS: Within the visualized portions of the thorax there are no suspicious
appearing pulmonary nodules or masses, there is no acute
consolidative airspace disease, no pleural effusions, no
pneumothorax and no lymphadenopathy. Visualized portions of the
upper abdomen are unremarkable. There are no aggressive appearing
lytic or blastic lesions noted in the visualized portions of the
skeleton.
IMPRESSION: No significant incidental noncardiac findings are noted.

## 2019-05-03 MED ORDER — NITROGLYCERIN 0.4 MG SL SUBL
SUBLINGUAL_TABLET | SUBLINGUAL | Status: AC
  Filled 2019-05-03: qty 1

## 2019-05-03 MED ORDER — SODIUM CHLORIDE 0.9 % IV BOLUS
750.0000 mL | Freq: Once | INTRAVENOUS | Status: AC
  Administered 2019-05-03: 13:00:00 750 mL via INTRAVENOUS

## 2019-05-03 MED ORDER — IOHEXOL 350 MG/ML SOLN
80.0000 mL | Freq: Once | INTRAVENOUS | Status: AC | PRN
  Administered 2019-05-03: 80 mL via INTRAVENOUS

## 2019-05-03 MED ORDER — NITROGLYCERIN 0.4 MG SL SUBL
0.4000 mg | SUBLINGUAL_TABLET | Freq: Once | SUBLINGUAL | Status: AC
  Administered 2019-05-03: 0.4 mg via SUBLINGUAL
  Filled 2019-05-03: qty 25

## 2019-05-04 ENCOUNTER — Telehealth: Payer: Self-pay | Admitting: *Deleted

## 2019-05-04 DIAGNOSIS — R0602 Shortness of breath: Secondary | ICD-10-CM

## 2019-05-04 DIAGNOSIS — R0789 Other chest pain: Secondary | ICD-10-CM

## 2019-05-04 NOTE — Telephone Encounter (Signed)
-----   Message from Dorothy Spark, MD sent at 05/03/2019  5:37 PM EDT ----- Her coronary CTA was completely normal, please order pulmonary function test. Thank you, K

## 2019-05-04 NOTE — Telephone Encounter (Signed)
Called the pt and made her aware of her Coronary CT results and recommendation per Dr Meda Coffee for her to get PFTs done, for further evaluation of her shortness of breath and heaviness in her chest. Informed the pt that I will place the order in the system and send our [facility] schedulers a message to call her back and arrange this appt. Pt verbalized understanding and agrees with this plan.

## 2019-05-07 NOTE — Telephone Encounter (Signed)
RE: please schedule PFTs per Dr Meda Coffee Received: Today Message Contents  Orion Crook, LPN        Left [facility]. scheduler detailed voice asking for return call re:PFT.appt.  Thanks renee

## 2019-05-08 NOTE — Telephone Encounter (Signed)
Pts PFTs are scheduled for 10/21 at 4 pm.  Pt made aware of appt date and time by [facility] scheduling.

## 2019-05-24 ENCOUNTER — Ambulatory Visit
Admission: RE | Admit: 2019-05-24 | Discharge: 2019-05-24 | Disposition: A | Discharge status: Home / Self Care | Payer: No Typology Code available for payment source | Source: Ambulatory Visit | Attending: Family Medicine | Admitting: Family Medicine

## 2019-05-24 ENCOUNTER — Other Ambulatory Visit: Payer: Self-pay

## 2019-05-24 DIAGNOSIS — Z1239 Encounter for other screening for malignant neoplasm of breast: Secondary | ICD-10-CM

## 2019-05-24 IMAGING — MG MM DIGITAL SCREENING BILAT W/ TOMO W/ CAD
8 series · 8 of 24 positions shown · non-contrast
Comparison: Previous exam(s).

CLINICAL DATA: Screening.

EXAM:
DIGITAL SCREENING BILATERAL MAMMOGRAM WITH TOMO AND CAD

[L MLO synth-2D]
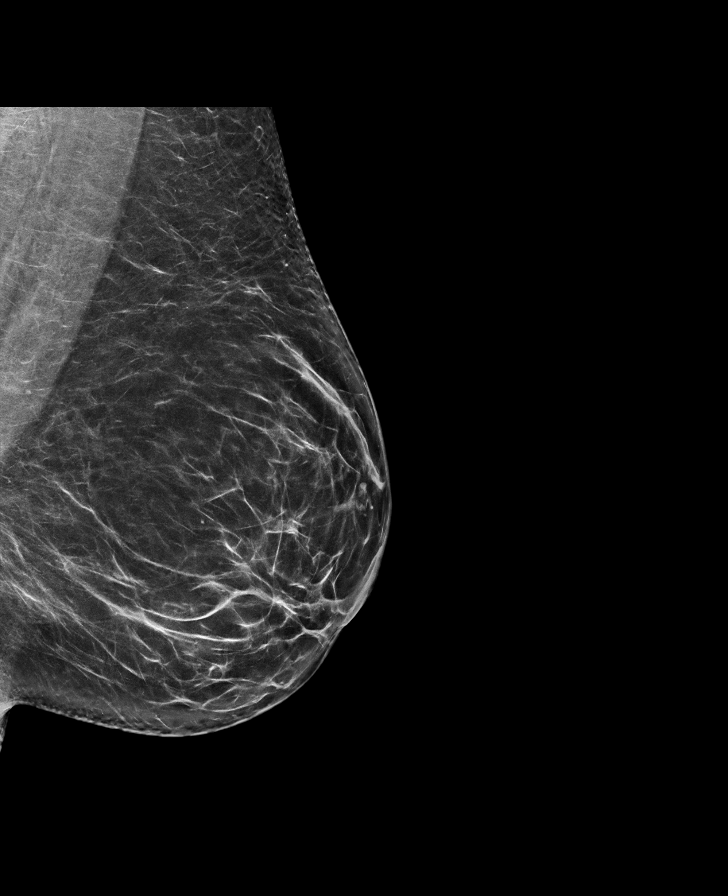

[R CC synth-2D]
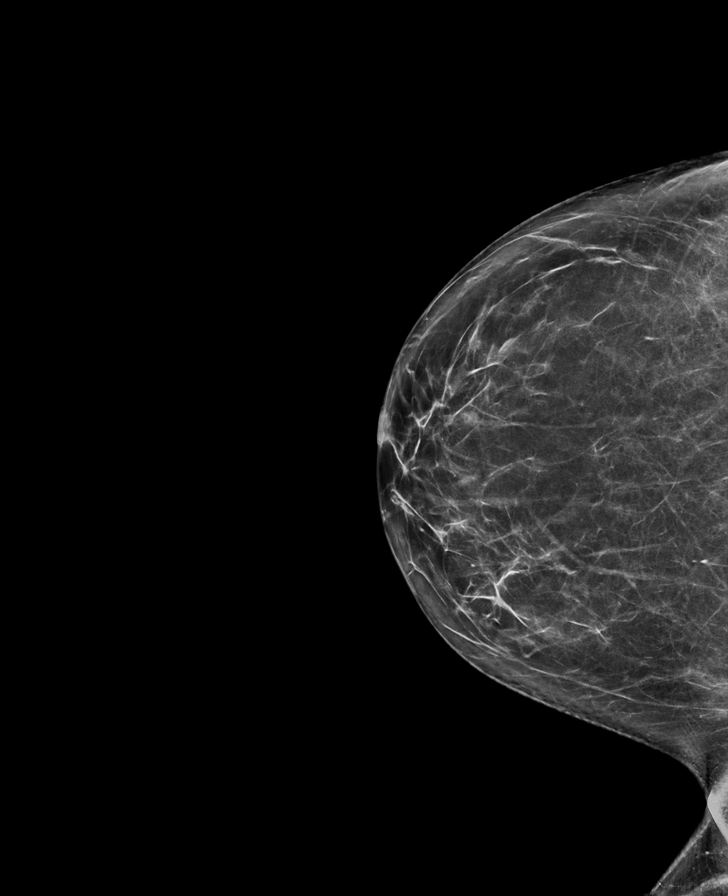

[R MLO synth-2D]
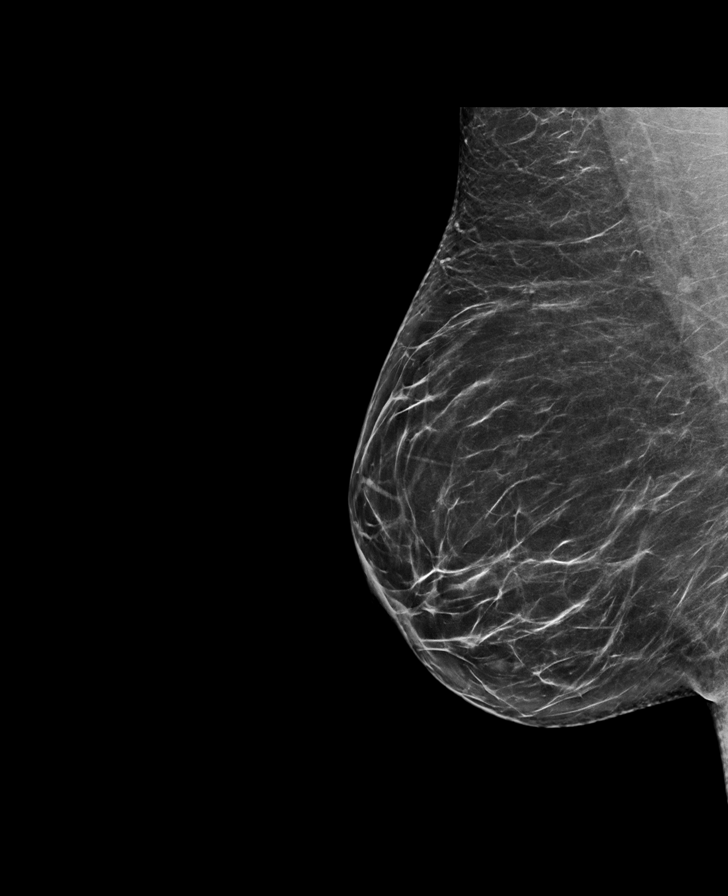

[L CC synth-2D]
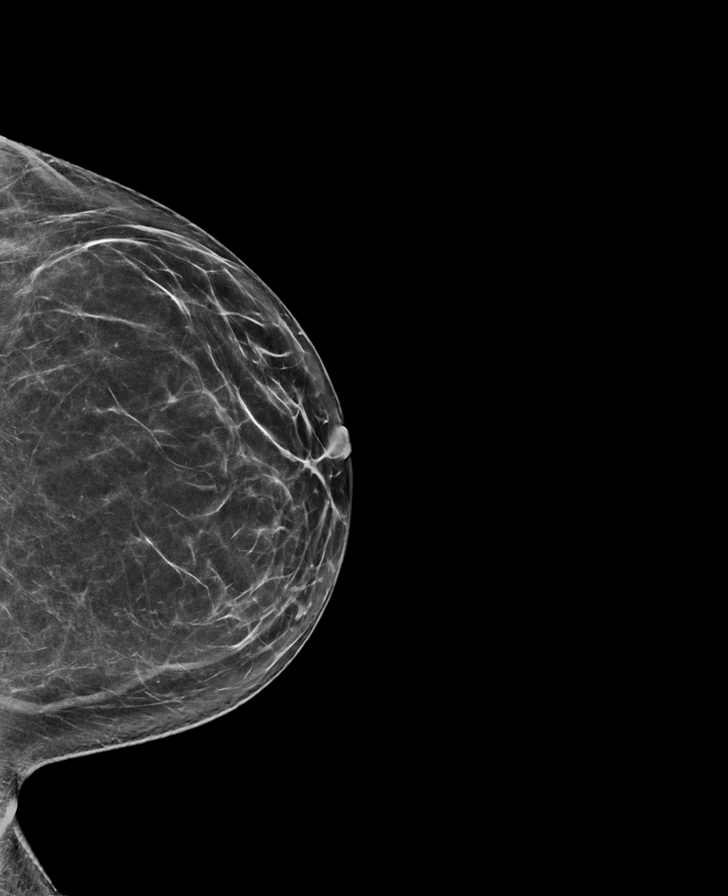

[L CC tomo · tomo slice 39/77.0]
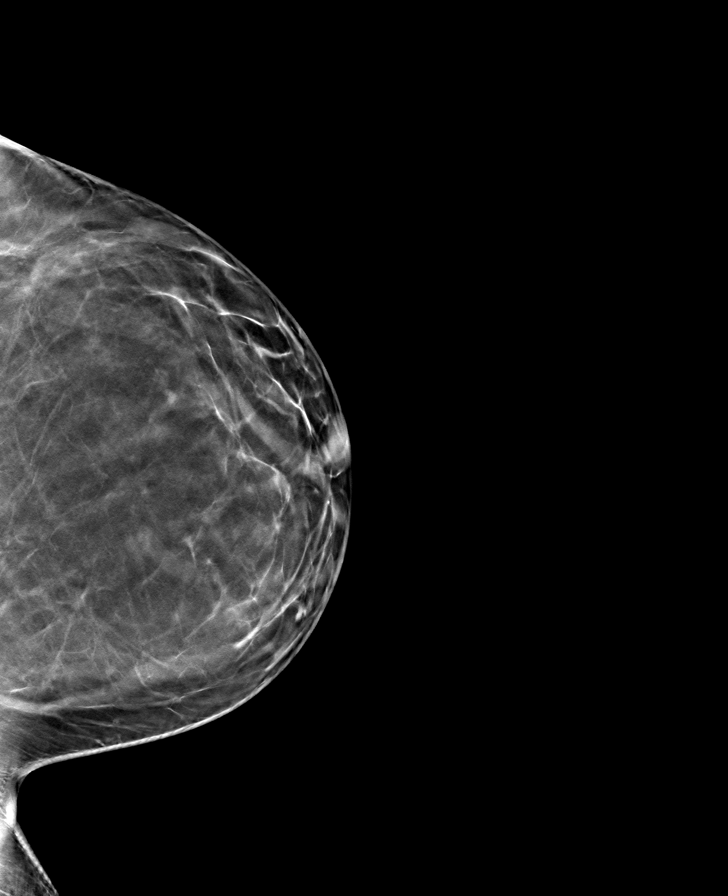

[R CC tomo · tomo slice 36/71.0]
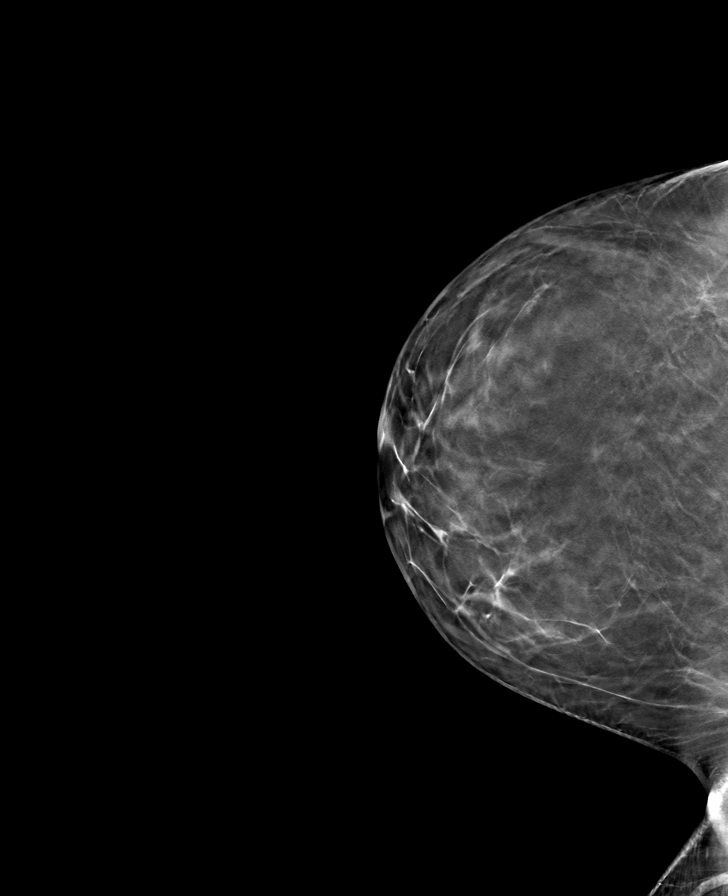

[R MLO tomo · tomo slice 40/79.0]
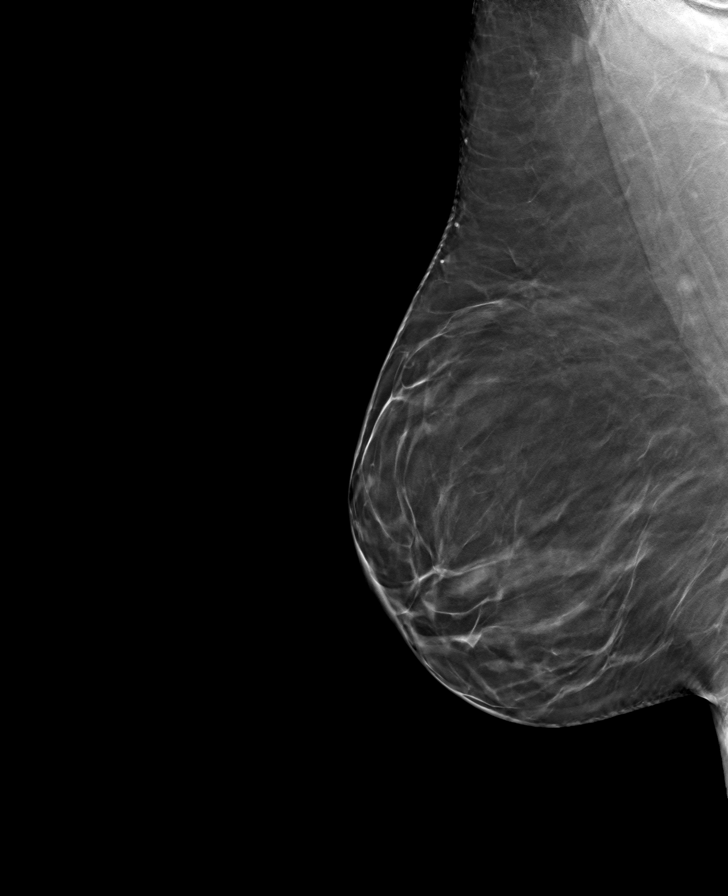

[L MLO tomo · tomo slice 41/82.0]
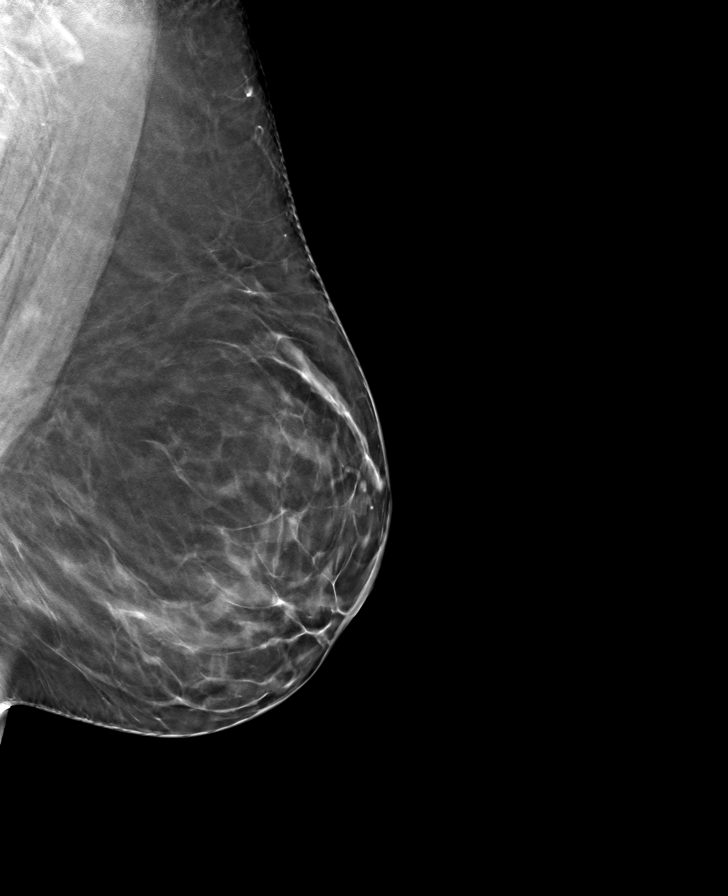

[8 of 24 positions shown; findings below may reference images not displayed]

ACR Breast Density Category b: There are scattered areas of
fibroglandular density.
FINDINGS: There are no findings suspicious for malignancy. Images were
processed with CAD.
IMPRESSION: No mammographic evidence of malignancy. A result letter of this
screening mammogram will be mailed directly to the patient.

RECOMMENDATION:
Screening mammogram in one year. (Code:[TQ])

BI-RADS CATEGORY  1: Negative.

## 2019-05-28 ENCOUNTER — Encounter: Payer: Self-pay | Admitting: *Deleted

## 2019-05-28 NOTE — Telephone Encounter (Signed)
Patient returning call - she can be reached at [phone]

## 2019-05-29 NOTE — Telephone Encounter (Signed)
Patient called back regarding covid testing - pt is able to go anytime as she works at [employer] and as a little flexibility. She is at work today and may not be able to answer her phone - her cell phone [phone] or work phone [phone] - she states it is okay to leave appt date and time in my chart -pr

## 2019-06-02 ENCOUNTER — Other Ambulatory Visit (HOSPITAL_COMMUNITY): Payer: No Typology Code available for payment source

## 2019-06-04 ENCOUNTER — Other Ambulatory Visit (HOSPITAL_COMMUNITY)
Admission: RE | Admit: 2019-06-04 | Discharge: 2019-06-04 | Disposition: A | Discharge status: Home / Self Care | Payer: No Typology Code available for payment source | Source: Ambulatory Visit | Attending: Family Medicine | Admitting: Family Medicine

## 2019-06-04 DIAGNOSIS — Z01812 Encounter for preprocedural laboratory examination: Secondary | ICD-10-CM | POA: Diagnosis not present

## 2019-06-04 DIAGNOSIS — Z20828 Contact with and (suspected) exposure to other viral communicable diseases: Secondary | ICD-10-CM | POA: Diagnosis not present

## 2019-06-04 LAB — SARS CORONAVIRUS 2 (TAT 6-24 HRS): SARS Coronavirus 2: NEGATIVE

## 2019-06-06 ENCOUNTER — Other Ambulatory Visit: Payer: Self-pay

## 2019-06-06 ENCOUNTER — Ambulatory Visit (INDEPENDENT_AMBULATORY_CARE_PROVIDER_SITE_OTHER): Payer: No Typology Code available for payment source | Admitting: Internal Medicine

## 2019-06-06 DIAGNOSIS — R0789 Other chest pain: Secondary | ICD-10-CM | POA: Diagnosis not present

## 2019-06-06 DIAGNOSIS — R0602 Shortness of breath: Secondary | ICD-10-CM

## 2019-06-06 LAB — PULMONARY FUNCTION TEST
DL/VA % pred: 101 %
DL/VA: 4.45 ml/min/mmHg/L
DLCO unc % pred: 106 %
DLCO unc: 21.57 ml/min/mmHg
FEF 25-75 Post: 3.01 L/sec
FEF 25-75 Pre: 2.14 L/sec
FEF2575-%Change-Post: 40 %
FEF2575-%Pred-Post: 107 %
FEF2575-%Pred-Pre: 76 %
FEV1-%Change-Post: 12 %
FEV1-%Pred-Post: 106 %
FEV1-%Pred-Pre: 94 %
FEV1-Post: 2.91 L
FEV1-Pre: 2.58 L
FEV1FVC-%Change-Post: 7 %
FEV1FVC-%Pred-Pre: 94 %
FEV6-%Change-Post: 4 %
FEV6-%Pred-Post: 106 %
FEV6-%Pred-Pre: 101 %
FEV6-Post: 3.54 L
FEV6-Pre: 3.4 L
FEV6FVC-%Change-Post: 0 %
FEV6FVC-%Pred-Post: 102 %
FEV6FVC-%Pred-Pre: 102 %
FVC-%Change-Post: 4 %
FVC-%Pred-Post: 104 %
FVC-%Pred-Pre: 99 %
FVC-Post: 3.56 L
FVC-Pre: 3.4 L
Post FEV1/FVC ratio: 82 %
Post FEV6/FVC ratio: 100 %
Pre FEV1/FVC ratio: 76 %
Pre FEV6/FVC Ratio: 100 %
RV % pred: 123 %
RV: 2.04 L
TLC % pred: 116 %
TLC: 5.64 L

## 2019-06-06 NOTE — Progress Notes (Signed)
Full PFT performed today. °

## 2019-06-07 ENCOUNTER — Telehealth: Payer: Self-pay

## 2019-06-07 MED ORDER — ALBUTEROL SULFATE HFA 108 (90 BASE) MCG/ACT IN AERS: 2.0000 | INHALATION_SPRAY | Freq: Four times a day (QID) | RESPIRATORY_TRACT | 1 refills | Status: DC | PRN

## 2019-06-07 NOTE — Telephone Encounter (Signed)
Spoke with patient about her PFT results. Patient verbalized understanding. Offered her prescription for albuterol if she was still SOB per Dr. Meda Coffee. She states that when they gave albuterol to her yesterday it made her feel jittery and shaky. I informed her that was a common side effect. She agreed to have prescription filled in case she needs it.

## 2019-06-07 NOTE — Telephone Encounter (Signed)
-----   Message from Nuala Alpha, LPN sent at [id number]  8:07 AM EDT -----  ----- Message ----- From: Dorothy Spark, MD Sent: 06/06/2019   8:57 PM EDT To: Nuala Alpha, LPN  Minimal Obstructive Airways Disease Slight response to bronchodilator. I can prescribe albuterol if she is still SOB.

## 2019-11-14 ENCOUNTER — Encounter: Payer: Self-pay | Admitting: Family Medicine

## 2020-01-03 ENCOUNTER — Encounter: Payer: Self-pay | Admitting: Family Medicine

## 2020-01-04 ENCOUNTER — Telehealth (INDEPENDENT_AMBULATORY_CARE_PROVIDER_SITE_OTHER): Payer: No Typology Code available for payment source | Admitting: Family Medicine

## 2020-01-04 DIAGNOSIS — G5792 Unspecified mononeuropathy of left lower limb: Secondary | ICD-10-CM | POA: Diagnosis not present

## 2020-01-04 DIAGNOSIS — M5441 Lumbago with sciatica, right side: Secondary | ICD-10-CM

## 2020-01-04 NOTE — Progress Notes (Signed)
Virtual Visit via Video Note  I connected with Gloria Torres  on 01/04/20 at 11:30 AM EDT by a video enabled telemedicine application and verified that I am speaking with the correct person using two identifiers.  Location patient: home Location provider:work or home office Persons participating in the virtual visit: patient, provider  I discussed the limitations of evaluation and management by telemedicine and the availability of in person appointments. The patient expressed understanding and agreed to proceed.   Gloria Torres DOB: 1971/01/06 Encounter date: 01/04/2020  This is a 49 y.o. female who presents with No chief complaint on file.   History of present illness: Not sure if issue is related to her back (back still doesn't feel right). She was at work the other day and felt like cold, wet drops on left inner thigh. Happened again later that day and then in evening. Would feel it higher in leg first, then lower in legs with legs elevated. Happened twice yesterday, not happened today. No pain in the leg. Just feels like cold, wet drop. Left mid thigh is where she has felt this and felt within a few inch diameter.  Back pain is on lower right - increased with activity - gets pinching that is in spine/SI area. If she rests then it improves. It is documented as left lower back, but it is right. No radiation down leg. Sometimes radiates down to buttock. No weakness in legs. Back does bother her daily, but more just in moments.   Catches, grinds, and then can go away.    Allergies  Allergen Reactions  . Sulfa Antibiotics Hives  . Erythromycin Rash   No outpatient medications have been marked as taking for the 01/04/20 encounter (Video Visit) with Caren Macadam, MD.    Review of Systems  Constitutional: Negative for chills, fatigue and fever.  Respiratory: Negative for cough, chest tightness, shortness of breath and wheezing.   Cardiovascular: Negative for chest pain,  palpitations and leg swelling.  Musculoskeletal: Positive for back pain.  Neurological: Positive for numbness. Negative for weakness.    Objective:  There were no vitals taken for this visit.      BP Readings from Last 3 Encounters:  05/03/19 (!) 93/56  05/01/19 100/68  03/19/19 (!) 90/50   Wt Readings from Last 3 Encounters:  05/01/19 170 lb (77.1 kg)  03/19/19 164 lb 3.2 oz (74.5 kg)  07/17/18 157 lb (71.2 kg)    EXAM:  GENERAL: alert, oriented, appears well and in no acute distress  HEENT: atraumatic, conjunctiva clear, no obvious abnormalities on inspection of external nose and ears  NECK: normal movements of the head and neck  LUNGS: on inspection no signs of respiratory distress, breathing rate appears normal, no obvious gross SOB, gasping or wheezing  CV: no obvious cyanosis  MS: moves all visible extremities without noticeable abnormality  PSYCH/NEURO: pleasant and cooperative, no obvious depression or anxiety, speech and thought processing grossly intact   Assessment/Plan  1. Right-sided low back pain with right-sided sciatica, unspecified chronicity This is been going on since 2019.  She had a work injury but continues to have discomfort that exacerbates intermittently.  Uncertain if this is related to current neuropathy of the thigh, but since she is having ongoing discomfort, will obtain an x-ray to start further evaluation.  Once x-ray results are obtained, I will check in with her.  If still having neuropathy, or if worsening, we can consider further evaluation and potentially MRI. - DG  Lumbar Spine Complete; Future  2. Neuropathy of left thigh See above.  She had blood work done within the year that included evaluation for abnormalities that would contribute to neuropathy (B12 deficiency anemia) and all was normal/stable.  I feel that we should start with a back evaluation as above, and consider further evaluation for the neuropathy if continuing/worsening.   - DG Lumbar Spine Complete; Future    Return for pending xray.   I discussed the assessment and treatment plan with the patient. The patient was provided an opportunity to ask questions and all were answered. The patient agreed with the plan and demonstrated an understanding of the instructions.   The patient was advised to call back or seek an in-person evaluation if the symptoms worsen or if the condition fails to improve as anticipated.  I provided 20 minutes of non-face-to-face time during this encounter.   Micheline Rough, MD

## 2020-01-08 ENCOUNTER — Ambulatory Visit (HOSPITAL_COMMUNITY)
Admission: RE | Admit: 2020-01-08 | Discharge: 2020-01-08 | Disposition: A | Discharge status: Home / Self Care | Payer: No Typology Code available for payment source | Source: Ambulatory Visit | Attending: Family Medicine | Admitting: Family Medicine

## 2020-01-08 ENCOUNTER — Other Ambulatory Visit: Payer: Self-pay

## 2020-01-08 DIAGNOSIS — M5441 Lumbago with sciatica, right side: Secondary | ICD-10-CM | POA: Diagnosis not present

## 2020-01-08 DIAGNOSIS — G5792 Unspecified mononeuropathy of left lower limb: Secondary | ICD-10-CM | POA: Diagnosis present

## 2020-01-08 IMAGING — CR DG LUMBAR SPINE COMPLETE 4+V
5 series · 5 of 5 positions shown · non-contrast
Comparison: None

CLINICAL DATA: Lumbar back pain, RIGHT side radiculopathy, LEFT
side neuropathy

EXAM:
LUMBAR SPINE - COMPLETE 4+ VIEW

[w lumbar spine ap (1 of 4)]
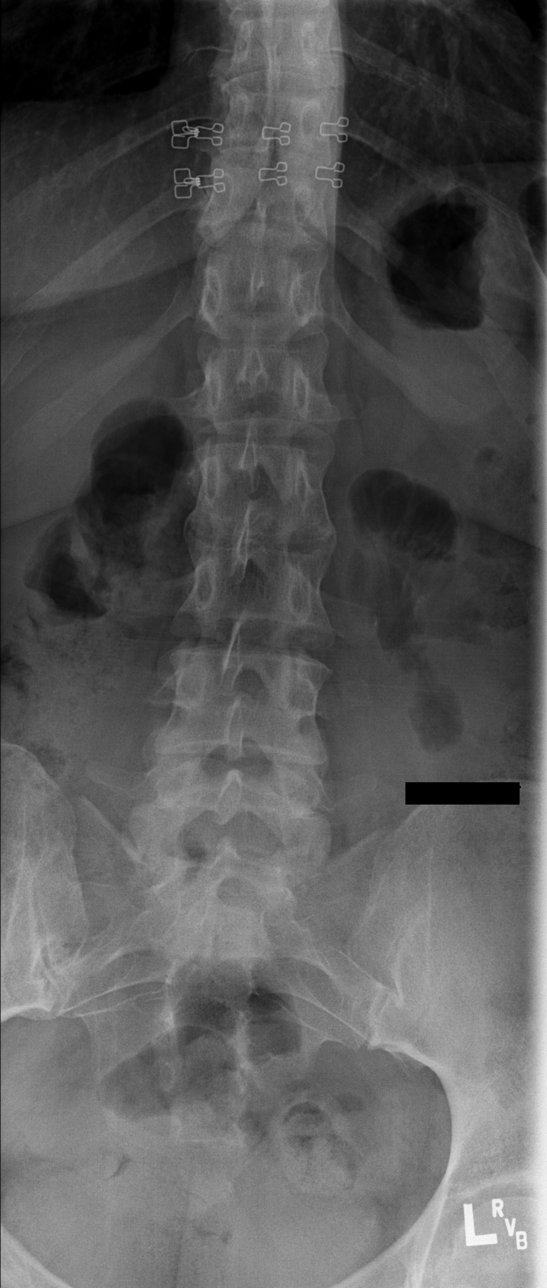

[w lumbar spine ap (2 of 4)]
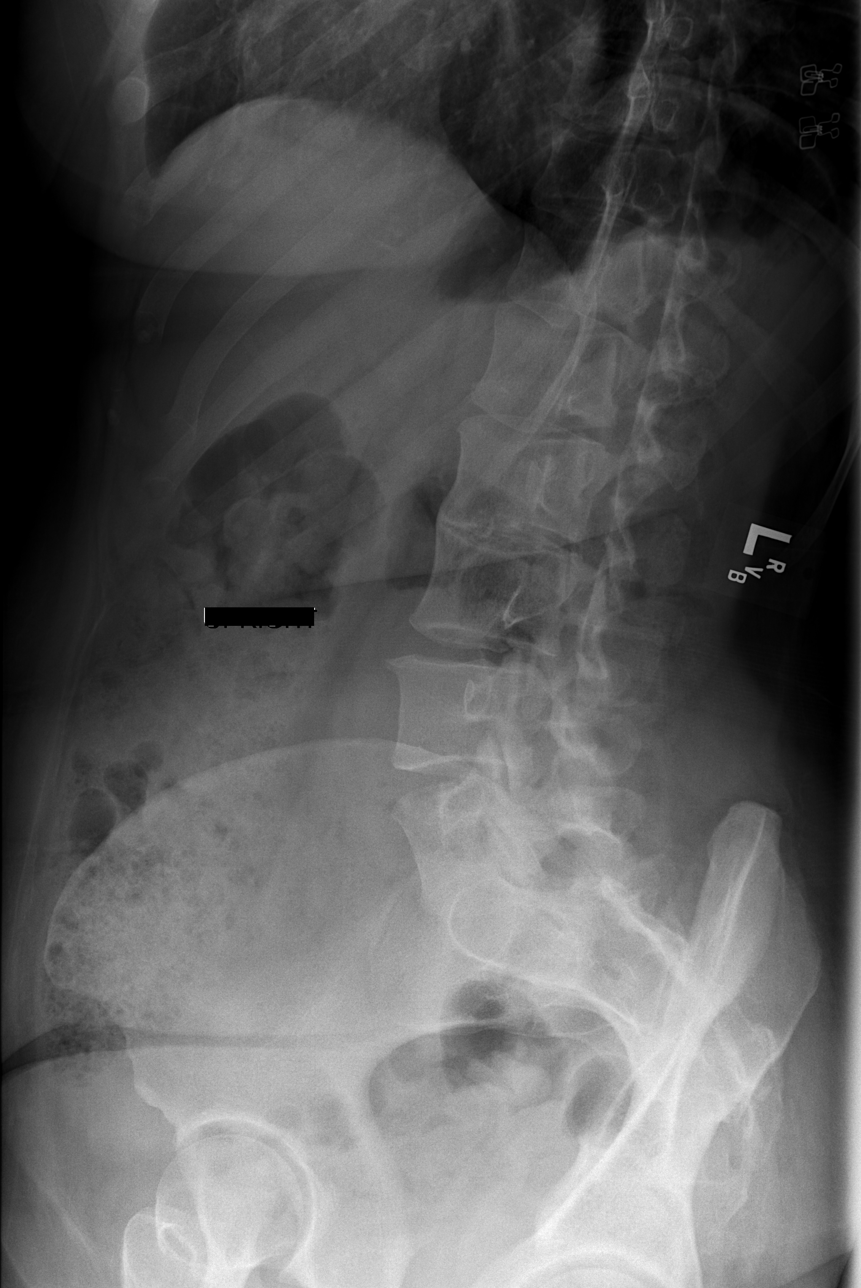

[w lumbar spine ap (3 of 4)]
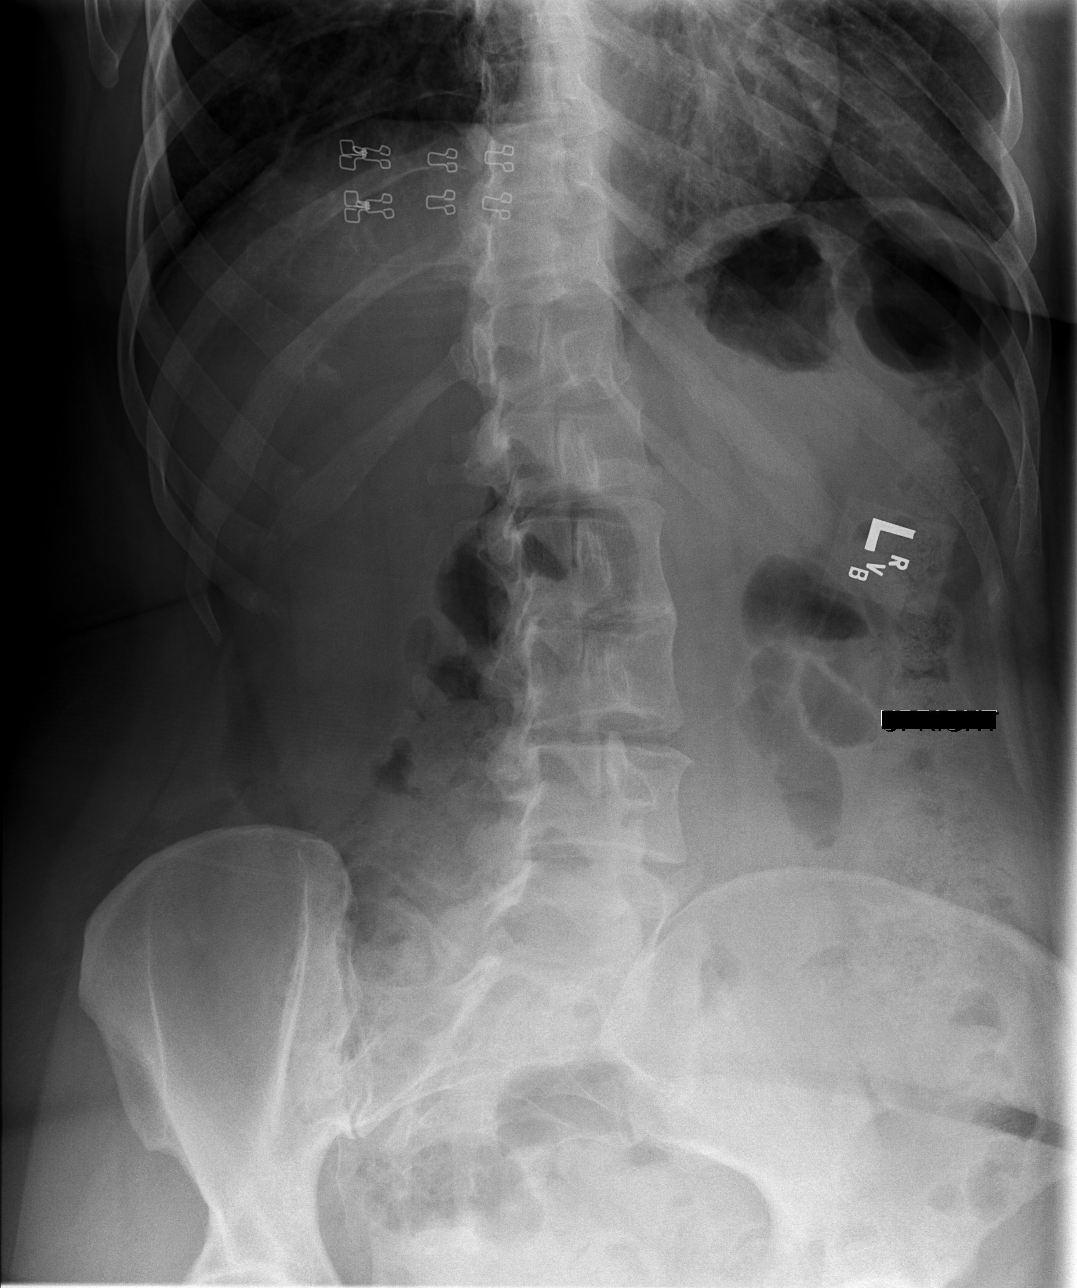

[w lumbar spine ap (4 of 4)]
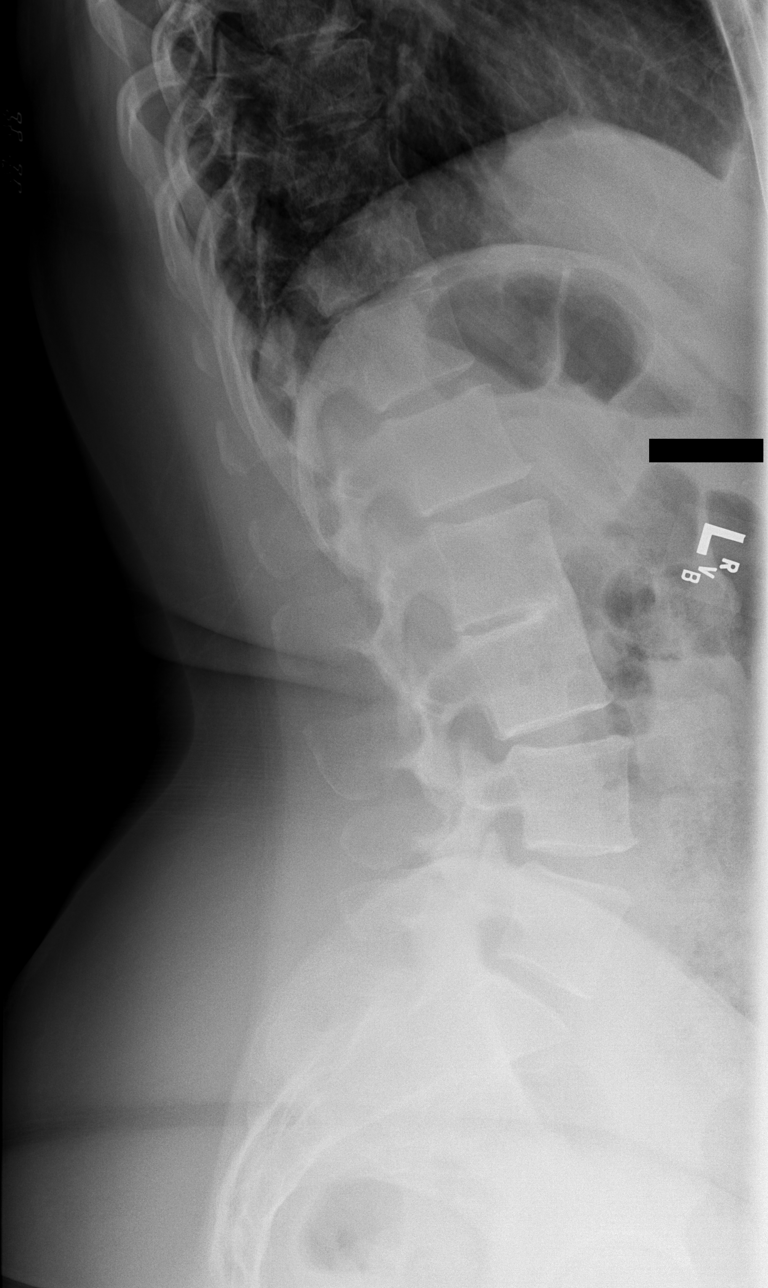

[w lumbar spine lat]
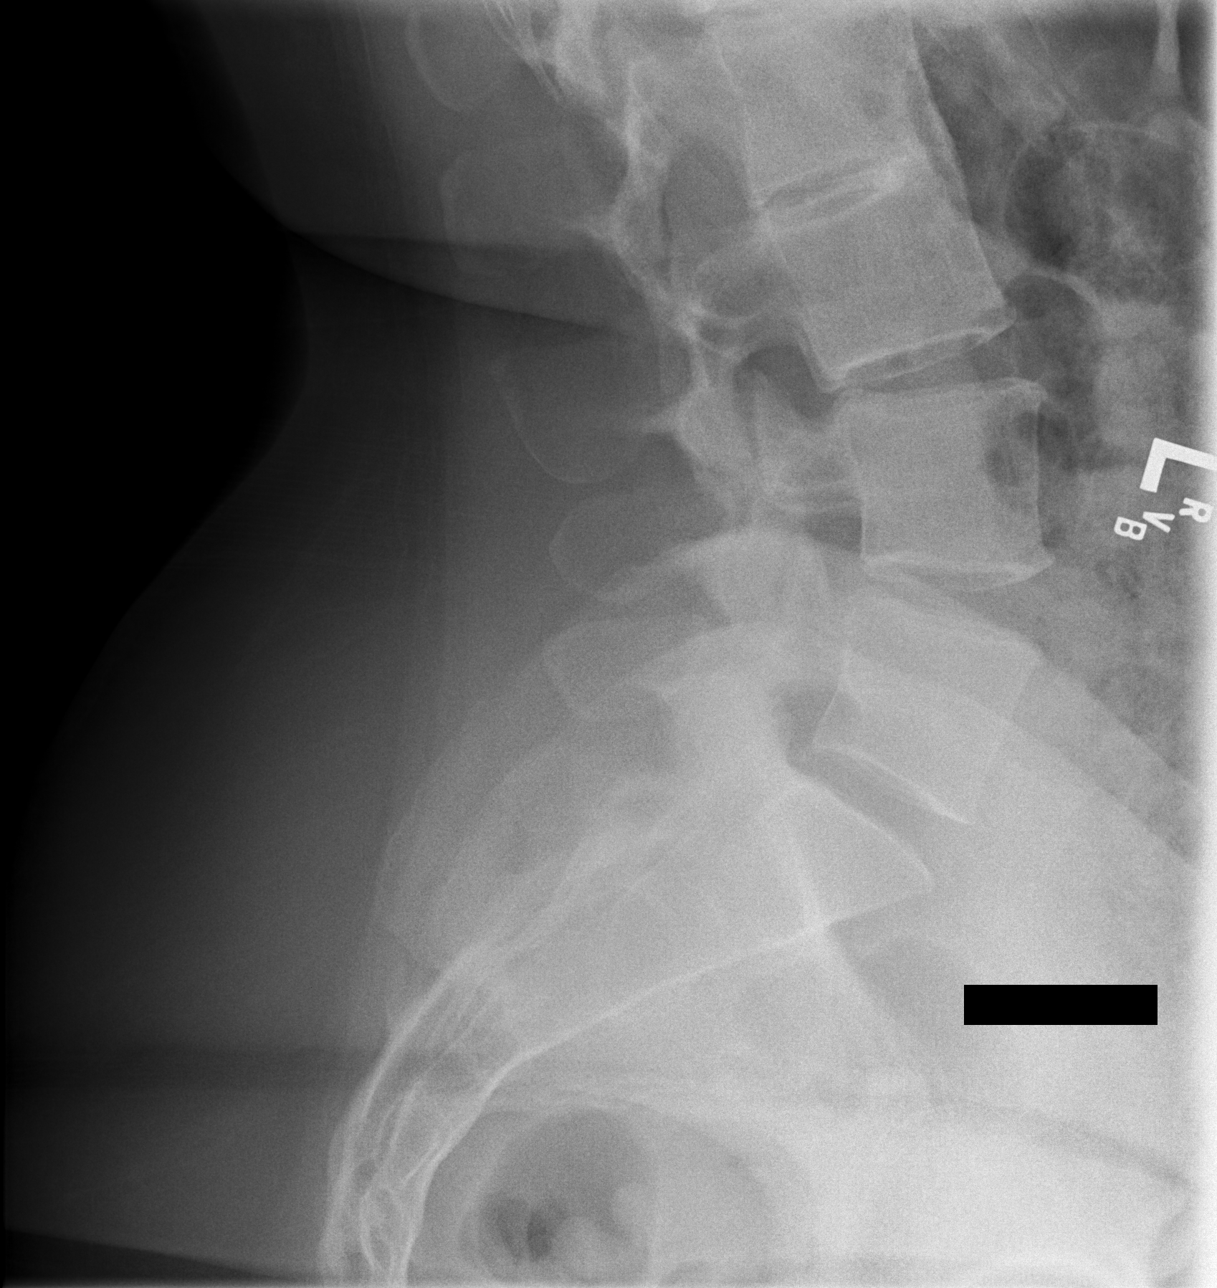

[5 of 5 positions shown; findings below may reference images not displayed]

FINDINGS: Five non-rib-bearing lumbar vertebra.

Partial congenital fusion of L2-L3 anteriorly.

Vertebral body and disc space heights otherwise maintained.

No fracture, subluxation or bone destruction.

SI joints preserved.

No obvious spondylolysis.
IMPRESSION: Congenital partial fusion L2-L3.

Otherwise negative exam.

## 2020-01-09 ENCOUNTER — Other Ambulatory Visit: Payer: Self-pay | Admitting: Family Medicine

## 2020-01-09 DIAGNOSIS — G629 Polyneuropathy, unspecified: Secondary | ICD-10-CM

## 2020-01-09 DIAGNOSIS — M5441 Lumbago with sciatica, right side: Secondary | ICD-10-CM

## 2020-01-09 DIAGNOSIS — Q7649 Other congenital malformations of spine, not associated with scoliosis: Secondary | ICD-10-CM

## 2020-02-05 ENCOUNTER — Encounter: Payer: Self-pay | Admitting: *Deleted

## 2020-02-21 ENCOUNTER — Ambulatory Visit (HOSPITAL_COMMUNITY)
Admission: RE | Admit: 2020-02-21 | Discharge: 2020-02-21 | Disposition: A | Discharge status: Home / Self Care | Payer: No Typology Code available for payment source | Source: Ambulatory Visit | Attending: Family Medicine | Admitting: Family Medicine

## 2020-02-21 ENCOUNTER — Other Ambulatory Visit: Payer: Self-pay

## 2020-02-21 DIAGNOSIS — M5441 Lumbago with sciatica, right side: Secondary | ICD-10-CM | POA: Diagnosis not present

## 2020-02-21 DIAGNOSIS — Q7649 Other congenital malformations of spine, not associated with scoliosis: Secondary | ICD-10-CM | POA: Diagnosis present

## 2020-02-21 DIAGNOSIS — G629 Polyneuropathy, unspecified: Secondary | ICD-10-CM | POA: Diagnosis present

## 2020-02-21 IMAGING — MR MR LUMBAR SPINE W/O CM
5 of 6 series · 25 of 48 positions shown · non-contrast
Comparison: Lumbar radiographs [DATE].

CLINICAL DATA: 48-year-old female with persistent intermittent low
back pain after an initial injury several years ago. New/increasing
lower extremity abnormal sensation.

EXAM:
MRI LUMBAR SPINE WITHOUT CONTRAST
TECHNIQUE: Multiplanar, multisequence MR imaging of the lumbar spine was
performed. No intravenous contrast was administered.

[Series 5: T1 · sagittal · 4.0mm · 0.81mm/px · 3 of 16 slices shown (1 of 2)]
[im 1/16]
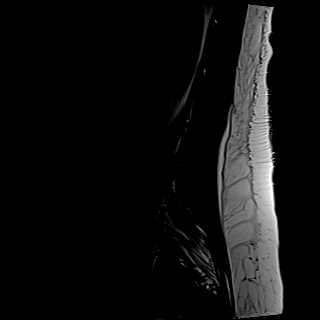
[im 8/16]
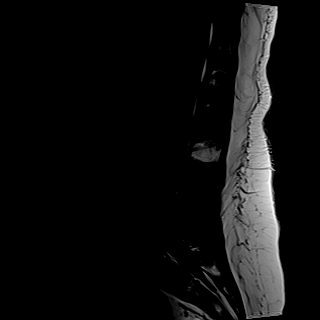
[im 16/16]
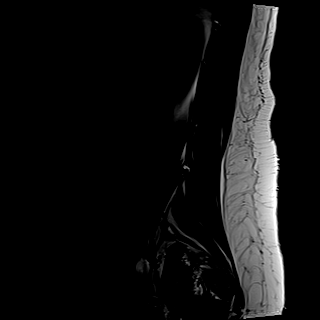

[Series 6: T2 · sagittal · 4.0mm · 0.81mm/px · 3 of 16 slices shown (1 of 2)]
[im 1/16]
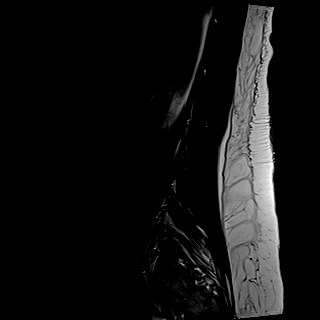
[im 8/16]
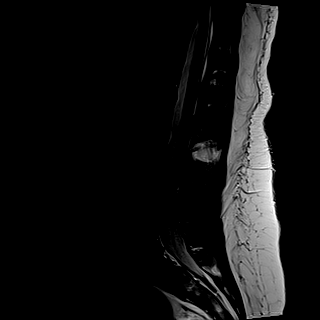
[im 16/16]
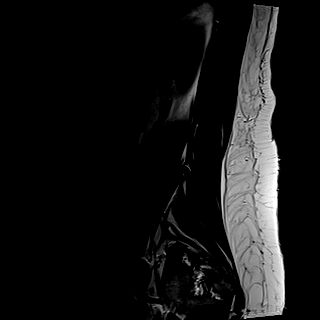

[Series 7: STIR · sagittal · 4.0mm · 0.51mm/px · 3 of 16 slices shown]
[im 1/16]
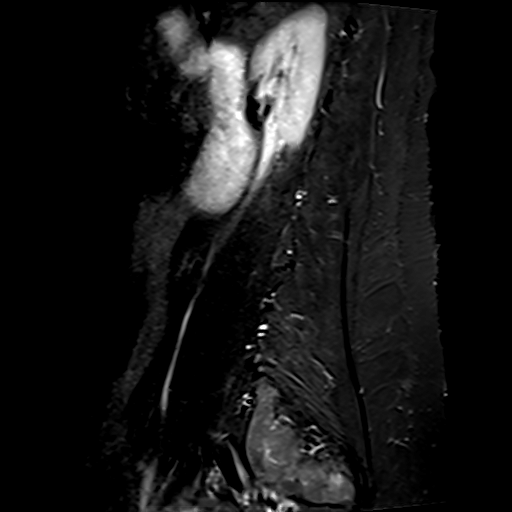
[im 8/16]
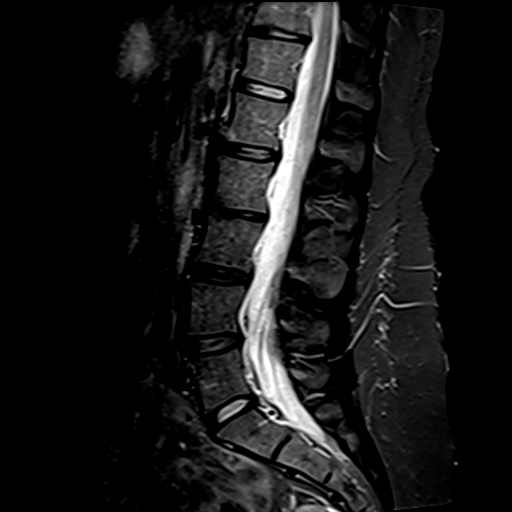
[im 16/16]
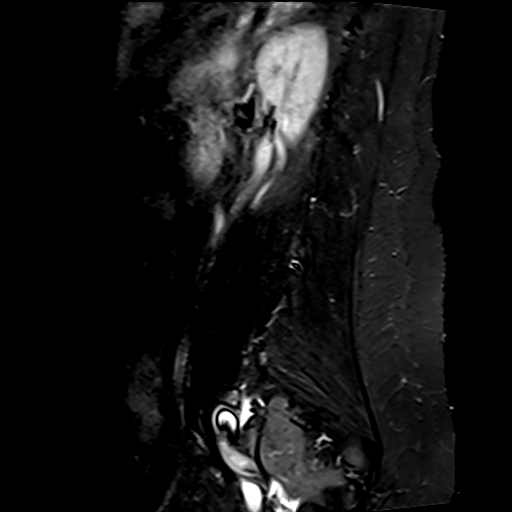

[Series 8: T2 · axial · 4.0mm · 0.62mm/px · z∈[-74,+169]mm · 8 of 43 slices shown (2 of 2)]
[im 1/43]
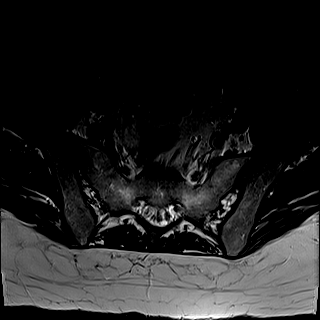
[im 7/43]
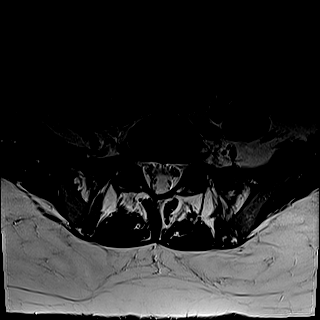
[im 13/43]
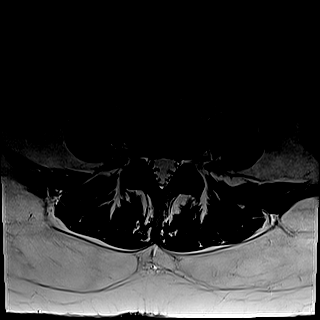
[im 19/43]
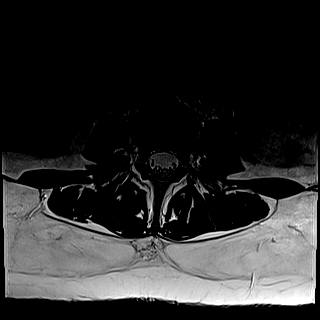
[im 25/43]
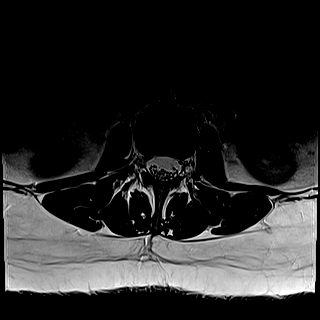
[im 31/43]
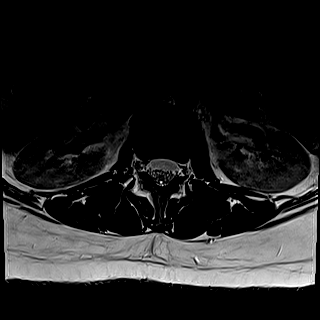
[im 37/43]
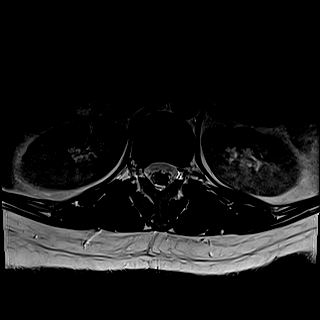
[im 43/43]
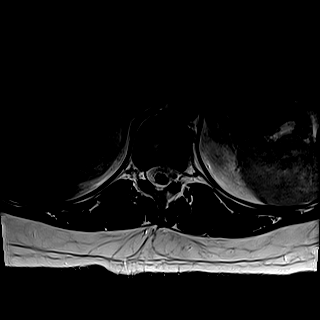

[Series 9: T1 · axial · 4.0mm · 0.39mm/px · z∈[-74,+169]mm · 8 of 43 slices shown (2 of 2)]
[im 1/43]
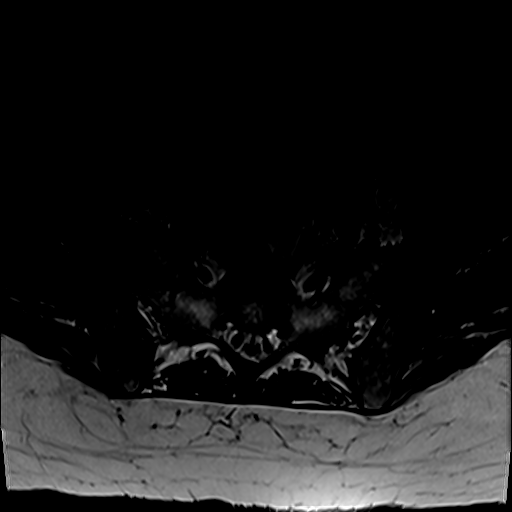
[im 7/43]
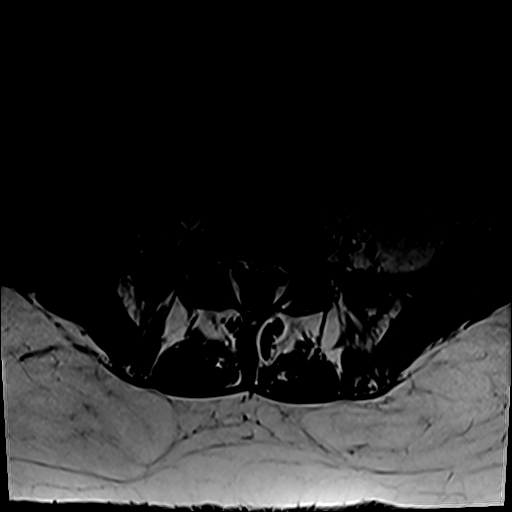
[im 13/43]
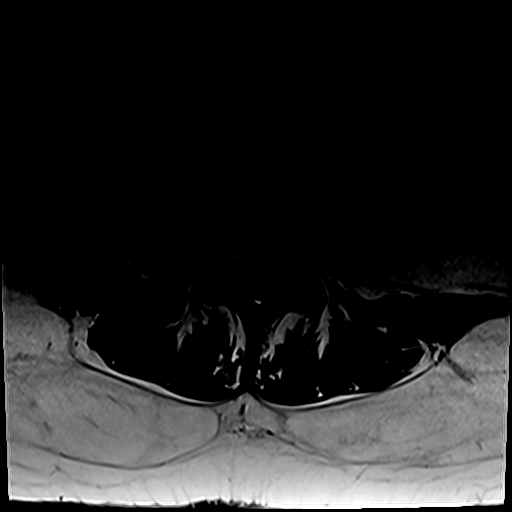
[im 19/43]
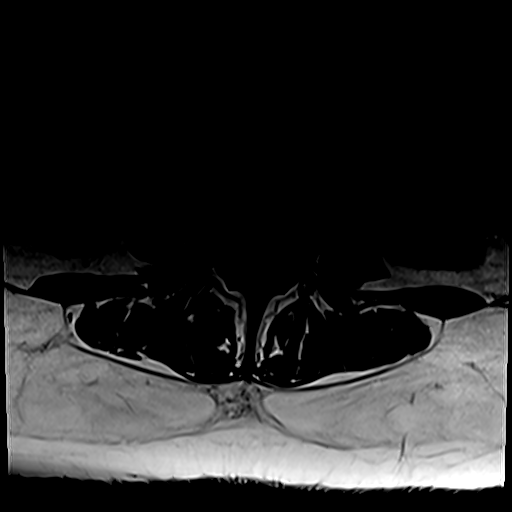
[im 25/43]
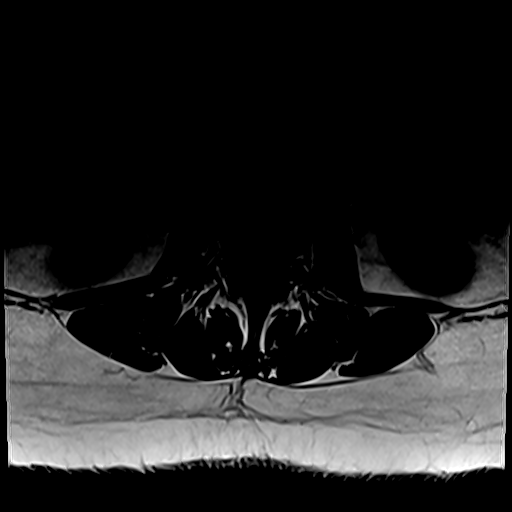
[im 31/43]
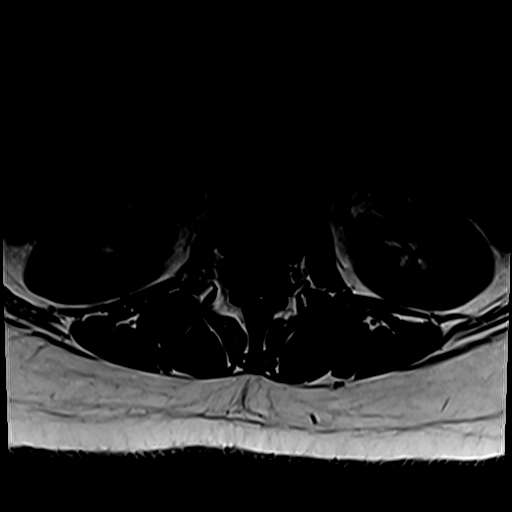
[im 37/43]
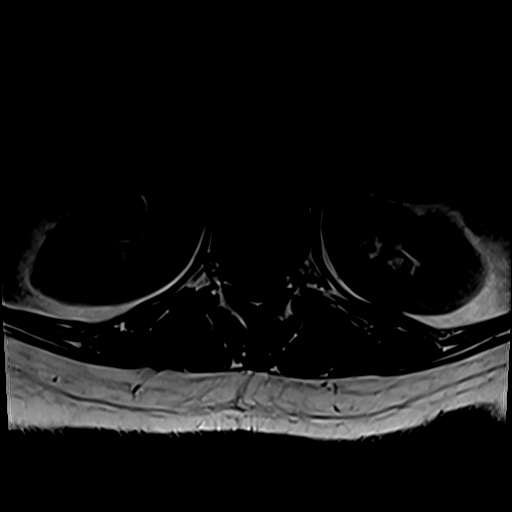
[im 43/43]
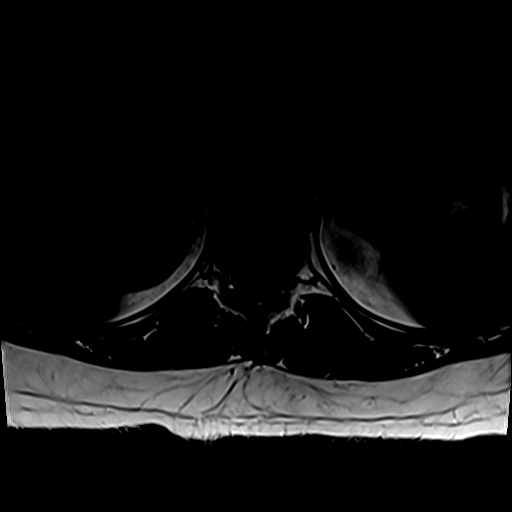

[25 of 48 positions shown; findings below may reference images not displayed]

FINDINGS: Segmentation:  Normal on the comparison radiographs.

Alignment: Stable lumbar lordosis, with straightening at L2-L3
related to ankylosis.

Vertebrae: Anterior interbody ankylosis at L2-L3 appears chronic,
possibly congenital. Normal lumbar vertebral morphology elsewhere.

However, there also appears to be congenitally hypoplastic left T11
posterior elements (series 6, image 10). See additional T11-T12
findings below.

No marrow edema or evidence of acute osseous abnormality. Visualized
bone marrow signal is within normal limits. Intact visible sacrum
and SI joints.

Conus medullaris and cauda equina: Conus extends to the L1 level.
Capacious spinal canal. No lower spinal cord or conus signal
abnormality. Bilateral cauda equina nerve roots appear normal.

Paraspinal and other soft tissues: Negative.

Disc levels:

T11-T12: Dysplastic appearance of the visible left posterior
elements associated with left paracentral small disc extrusion and
evidence of endplate spurring (series 6, image 9 and series 8, image
2). Although seemingly borderline to mild spinal stenosis here there
does appear to be mild left hemi cord mass effect, but no lower cord
signal abnormality. No T11 neural foraminal abnormality identified.

T12-L1:  Negative.

L1-L2:  Negative.

L2-L3: Ankylosis anteriorly with partial absence of the disc. No
superimposed degeneration or stenosis.

L3-L4: Mild disc desiccation and minimal circumferential disc
bulging. Mild posterior element hypertrophy. No stenosis.

L4-L5: Negative disc. Mild posterior element hypertrophy. No
stenosis.

L5-S1:  Negative disc.  Mild facet hypertrophy.  No stenosis.
IMPRESSION: 1. Partial interbody ankylosis at L2-L3 appears to be chronic and/or
congenital. No degeneration or stenosis at that level, but there is
very early degeneration of the disc and posterior elements at the
adjacent L3-L4 segment. Although no associated stenosis.

2. Also congenitally hypoplastic appearance of the left posterior
elements of T11, with associated left T11-T12 left paracentral mild
disc-osteophyte degeneration. Borderline to mild spinal stenosis and
possible mild mass effect on the left lower spinal cord there, but
no cord signal abnormality.

3. Mild lumbar facet hypertrophy elsewhere. No lumbar spinal
stenosis or convincing neural impingement.

## 2020-02-25 ENCOUNTER — Encounter: Payer: Self-pay | Admitting: Family Medicine

## 2020-02-25 DIAGNOSIS — G629 Polyneuropathy, unspecified: Secondary | ICD-10-CM

## 2020-02-25 DIAGNOSIS — Z13220 Encounter for screening for lipoid disorders: Secondary | ICD-10-CM

## 2020-02-25 DIAGNOSIS — Z131 Encounter for screening for diabetes mellitus: Secondary | ICD-10-CM

## 2020-02-25 DIAGNOSIS — M5416 Radiculopathy, lumbar region: Secondary | ICD-10-CM

## 2020-02-27 NOTE — Progress Notes (Signed)
noted 

## 2020-02-29 ENCOUNTER — Inpatient Hospital Stay (HOSPITAL_COMMUNITY): Admission: RE | Admit: 2020-02-29 | Payer: No Typology Code available for payment source | Source: Ambulatory Visit

## 2020-03-17 ENCOUNTER — Other Ambulatory Visit: Payer: Self-pay

## 2020-03-17 DIAGNOSIS — Z13220 Encounter for screening for lipoid disorders: Secondary | ICD-10-CM

## 2020-03-17 DIAGNOSIS — G629 Polyneuropathy, unspecified: Secondary | ICD-10-CM

## 2020-03-17 NOTE — Progress Notes (Signed)
Re-entered labs correctly/thx dmf

## 2020-03-20 ENCOUNTER — Other Ambulatory Visit (INDEPENDENT_AMBULATORY_CARE_PROVIDER_SITE_OTHER): Payer: No Typology Code available for payment source

## 2020-03-20 ENCOUNTER — Other Ambulatory Visit: Payer: Self-pay

## 2020-03-20 DIAGNOSIS — Z13220 Encounter for screening for lipoid disorders: Secondary | ICD-10-CM

## 2020-03-20 DIAGNOSIS — G629 Polyneuropathy, unspecified: Secondary | ICD-10-CM

## 2020-03-20 LAB — CBC WITH DIFFERENTIAL/PLATELET
Absolute Monocytes: 536 cells/uL (ref 200–950)
Basophils Absolute: 17 cells/uL (ref 0–200)
Basophils Relative: 0.3 %
Eosinophils Absolute: 91 cells/uL (ref 15–500)
Eosinophils Relative: 1.6 %
HCT: 38.1 % (ref 35.0–45.0)
Hemoglobin: 12.5 g/dL (ref 11.7–15.5)
Lymphs Abs: 1801 cells/uL (ref 850–3900)
MCH: 30.6 pg (ref 27.0–33.0)
MCHC: 32.8 g/dL (ref 32.0–36.0)
MCV: 93.2 fL (ref 80.0–100.0)
MPV: 11.2 fL (ref 7.5–12.5)
Monocytes Relative: 9.4 %
Neutro Abs: 3255 cells/uL (ref 1500–7800)
Neutrophils Relative %: 57.1 %
Platelets: 226 10*3/uL (ref 140–400)
RBC: 4.09 10*6/uL (ref 3.80–5.10)
RDW: 13.2 % (ref 11.0–15.0)
Total Lymphocyte: 31.6 %
WBC: 5.7 10*3/uL (ref 3.8–10.8)

## 2020-03-20 LAB — LIPID PANEL
Cholesterol: 217 mg/dL — ABNORMAL HIGH (ref <200)
HDL: 55 mg/dL (ref >=50)
LDL Cholesterol (Calc): 136 mg/dL (calc) — ABNORMAL HIGH
Non-HDL Cholesterol (Calc): 162 mg/dL (calc) — ABNORMAL HIGH (ref <130)
Total CHOL/HDL Ratio: 3.9 (calc) (ref <5.0)
Triglycerides: 131 mg/dL (ref <150)

## 2020-03-20 LAB — COMPREHENSIVE METABOLIC PANEL
AG Ratio: 1.7 (calc) (ref 1.0–2.5)
ALT: 18 U/L (ref 6–29)
AST: 20 U/L (ref 10–35)
Albumin: 4.1 g/dL (ref 3.6–5.1)
Alkaline phosphatase (APISO): 66 U/L (ref 31–125)
BUN: 12 mg/dL (ref 7–25)
CO2: 26 mmol/L (ref 20–32)
Calcium: 9 mg/dL (ref 8.6–10.2)
Chloride: 104 mmol/L (ref 98–110)
Creat: 0.78 mg/dL (ref 0.50–1.10)
Globulin: 2.4 g/dL (calc) (ref 1.9–3.7)
Glucose, Bld: 82 mg/dL (ref 65–99)
Potassium: 4.5 mmol/L (ref 3.5–5.3)
Sodium: 137 mmol/L (ref 135–146)
Total Bilirubin: 0.5 mg/dL (ref 0.2–1.2)
Total Protein: 6.5 g/dL (ref 6.1–8.1)

## 2020-03-20 LAB — VITAMIN B12: Vitamin B-12: 444 pg/mL (ref 200–1100)

## 2020-03-20 LAB — TSH: TSH: 1.9 mIU/L

## 2020-04-01 ENCOUNTER — Other Ambulatory Visit: Payer: Self-pay

## 2020-04-01 ENCOUNTER — Encounter: Payer: Self-pay | Admitting: Family Medicine

## 2020-04-01 ENCOUNTER — Ambulatory Visit (INDEPENDENT_AMBULATORY_CARE_PROVIDER_SITE_OTHER): Payer: No Typology Code available for payment source | Admitting: Family Medicine

## 2020-04-01 DIAGNOSIS — G8929 Other chronic pain: Secondary | ICD-10-CM | POA: Diagnosis not present

## 2020-04-01 DIAGNOSIS — M5441 Lumbago with sciatica, right side: Secondary | ICD-10-CM

## 2020-04-01 DIAGNOSIS — M5442 Lumbago with sciatica, left side: Secondary | ICD-10-CM | POA: Diagnosis not present

## 2020-04-01 NOTE — Progress Notes (Signed)
Office Visit Note   Patient: Gloria Torres           Date of Birth: 01/19/1971           MRN: [id number] Visit Date: 04/01/2020 Requested by: Caren Macadam, MD [place],  [place] [address] PCP: Caren Macadam, MD  Subjective: Chief Complaint  Patient presents with  . Lower Back - Pain    Intermittent pain right lower back, into the buttock - x 2 years. New sensations of cold metal touching legs, left more than right, for the last couple months.    HPI: She is here with right-sided low back pain.  Symptoms started about 2 years ago with a pressure sensation in her right posterior hip/low back area.  She then noticed a grinding sensation.  After a while her pain became manageable but it never went away completely.  In the past year she has had intermittent sensations of "cold metal touching her legs", down the back of her legs in the L4 and S1 distribution.  The left leg bothers her more than the right but they both bother her almost every day.  It does not seem to be positional.  Recently she went to [place] on vacation and all of her symptoms improved significantly.  She has had x-rays and MRI scan which did not show any compressive lesions in her spine.  She did have anterior interbody ankylosis at L2-3.  No significant SI joint abnormality.  Denies any bowel or bladder dysfunction.  She does have chronic acid reflux troubles for which she takes Protonix.  She is not on any other prescription medications.  Family history is unrevealing.                ROS:   All other systems were reviewed and are negative.  Objective: Vital Signs: There were no vitals taken for this visit.  Physical Exam:  General:  Alert and oriented, in no acute distress. Pulm:  Breathing unlabored. Psy:  Normal mood, congruent affect. Skin: No visible rash Low back: She has no scoliosis, negative stork test.  She is tender near the right SI joint.  No pain in the sciatic notch, no  pain with internal hip rotation, straight leg raise negative.  Lower extremity strength and reflexes are normal.   Imaging: None today.  Recent MRI reviewed.  Assessment & Plan: 1.  Chronic right-sided low back pain with possible SI joint dysfunction.  Question neuropathy, but her symptoms are very dermatomal. -We will try chiropractic.  Also sublingual B12, magnesium, and alpha lipoic acid. -We will draw some additional labs. -Consider doing a food elimination diet. -Consider weaning off proton pump inhibitor and switching to H2 blocker if dietary changes will allow.     Procedures: No procedures performed  No notes on file     PMFS History: Patient Active Problem List   Diagnosis Date Noted  . Lumbar back pain with radiculopathy affecting right lower extremity 07/17/2018   History reviewed. No pertinent past medical history.  Family History  Problem Relation Age of Onset  . Memory loss Mother   . Diabetes Father   . Prostate cancer Father   . Asthma Sister   . Healthy Brother   . Colon cancer Maternal Grandmother   . Lung cancer Maternal Grandfather   . Hodgkin's lymphoma Paternal Grandmother   . Heart failure Paternal Grandfather     Past Surgical History:  Procedure Laterality Date  . CESAREAN  SECTION    . TONSILLECTOMY  1992   Social History   Occupational History  . Occupation: CT Engineer, production: [place]  Tobacco Use  . Smoking status: Never Smoker  . Smokeless tobacco: Never Used  Vaping Use  . Vaping Use: Never used  Substance and Sexual Activity  . Alcohol use: Yes    Comment: rare  . Drug use: No  . Sexual activity: Yes

## 2020-04-02 LAB — CYCLIC CITRUL PEPTIDE ANTIBODY, IGG: Cyclic Citrullin Peptide Ab: <16 UNITS

## 2020-04-02 LAB — C-REACTIVE PROTEIN: CRP: 2.6 mg/L (ref <8.0)

## 2020-04-02 LAB — RHEUMATOID FACTOR: Rheumatoid fact SerPl-aCnc: <14 IU/mL (ref <14)

## 2020-04-02 LAB — SEDIMENTATION RATE: Sed Rate: 6 mm/h (ref 0–20)

## 2020-04-02 LAB — HLA-B27 ANTIGEN: HLA-B27 Antigen: NEGATIVE

## 2020-04-02 LAB — ANA: Anti Nuclear Antibody (ANA): NEGATIVE

## 2020-04-02 LAB — URIC ACID: Uric Acid, Serum: 3.5 mg/dL (ref 2.5–7.0)

## 2020-04-03 ENCOUNTER — Telehealth: Payer: Self-pay | Admitting: Family Medicine

## 2020-04-03 NOTE — Telephone Encounter (Signed)
Labs show:  All values look good/normal.

## 2020-04-25 ENCOUNTER — Other Ambulatory Visit: Payer: Self-pay | Admitting: Cardiology

## 2020-04-25 DIAGNOSIS — R9431 Abnormal electrocardiogram [ECG] [EKG]: Secondary | ICD-10-CM

## 2020-04-25 DIAGNOSIS — Z01812 Encounter for preprocedural laboratory examination: Secondary | ICD-10-CM

## 2020-04-25 DIAGNOSIS — R079 Chest pain, unspecified: Secondary | ICD-10-CM

## 2020-07-01 ENCOUNTER — Encounter: Payer: Self-pay | Admitting: Family Medicine

## 2020-07-01 DIAGNOSIS — N912 Amenorrhea, unspecified: Secondary | ICD-10-CM

## 2020-07-02 ENCOUNTER — Other Ambulatory Visit: Payer: Self-pay | Admitting: Family Medicine

## 2020-07-02 DIAGNOSIS — Z124 Encounter for screening for malignant neoplasm of cervix: Secondary | ICD-10-CM

## 2020-12-11 ENCOUNTER — Other Ambulatory Visit (HOSPITAL_COMMUNITY): Payer: Self-pay

## 2020-12-11 MED ORDER — CARESTART COVID-19 HOME TEST VI KIT
PACK | 0 refills | Status: DC
  Filled 2020-12-11: qty 4, 4d supply, fill #0

## 2021-09-25 ENCOUNTER — Encounter: Payer: Self-pay | Admitting: Family Medicine

## 2021-09-25 ENCOUNTER — Ambulatory Visit (INDEPENDENT_AMBULATORY_CARE_PROVIDER_SITE_OTHER): Payer: No Typology Code available for payment source | Admitting: Family Medicine

## 2021-09-25 ENCOUNTER — Other Ambulatory Visit: Payer: Self-pay | Admitting: Family Medicine

## 2021-09-25 ENCOUNTER — Ambulatory Visit (INDEPENDENT_AMBULATORY_CARE_PROVIDER_SITE_OTHER): Payer: No Typology Code available for payment source

## 2021-09-25 ENCOUNTER — Other Ambulatory Visit: Payer: Self-pay

## 2021-09-25 VITALS — BP 104/62 | HR 58 | Temp 98.1°F | Ht 63.5 in | Wt 164.7 lb

## 2021-09-25 DIAGNOSIS — Z0000 Encounter for general adult medical examination without abnormal findings: Secondary | ICD-10-CM | POA: Diagnosis not present

## 2021-09-25 DIAGNOSIS — Z131 Encounter for screening for diabetes mellitus: Secondary | ICD-10-CM | POA: Diagnosis not present

## 2021-09-25 DIAGNOSIS — M25511 Pain in right shoulder: Secondary | ICD-10-CM | POA: Diagnosis not present

## 2021-09-25 DIAGNOSIS — Z1231 Encounter for screening mammogram for malignant neoplasm of breast: Secondary | ICD-10-CM

## 2021-09-25 DIAGNOSIS — Z1211 Encounter for screening for malignant neoplasm of colon: Secondary | ICD-10-CM

## 2021-09-25 DIAGNOSIS — G629 Polyneuropathy, unspecified: Secondary | ICD-10-CM

## 2021-09-25 DIAGNOSIS — Z13220 Encounter for screening for lipoid disorders: Secondary | ICD-10-CM

## 2021-09-25 DIAGNOSIS — R29898 Other symptoms and signs involving the musculoskeletal system: Secondary | ICD-10-CM

## 2021-09-25 LAB — COMPREHENSIVE METABOLIC PANEL
ALT: 10 U/L (ref 0–35)
AST: 15 U/L (ref 0–37)
Albumin: 4.1 g/dL (ref 3.5–5.2)
Alkaline Phosphatase: 51 U/L (ref 39–117)
BUN: 13 mg/dL (ref 6–23)
CO2: 28 mEq/L (ref 19–32)
Calcium: 9.3 mg/dL (ref 8.4–10.5)
Chloride: 102 mEq/L (ref 96–112)
Creatinine, Ser: 0.78 mg/dL (ref 0.40–1.20)
GFR: 89.18 mL/min (ref >60.00)
Glucose, Bld: 84 mg/dL (ref 70–99)
Potassium: 3.8 mEq/L (ref 3.5–5.1)
Sodium: 136 mEq/L (ref 135–145)
Total Bilirubin: 0.5 mg/dL (ref 0.2–1.2)
Total Protein: 6.6 g/dL (ref 6.0–8.3)

## 2021-09-25 LAB — CBC WITH DIFFERENTIAL/PLATELET
Basophils Absolute: 0 10*3/uL (ref 0.0–0.1)
Basophils Relative: 0.3 % (ref 0.0–3.0)
Eosinophils Absolute: 0.1 10*3/uL (ref 0.0–0.7)
Eosinophils Relative: 0.8 % (ref 0.0–5.0)
HCT: 39.8 % (ref 36.0–46.0)
Hemoglobin: 13.1 g/dL (ref 12.0–15.0)
Lymphocytes Relative: 33.8 % (ref 12.0–46.0)
Lymphs Abs: 2.6 10*3/uL (ref 0.7–4.0)
MCHC: 32.8 g/dL (ref 30.0–36.0)
MCV: 94.2 fl (ref 78.0–100.0)
Monocytes Absolute: 0.6 10*3/uL (ref 0.1–1.0)
Monocytes Relative: 8.4 % (ref 3.0–12.0)
Neutro Abs: 4.3 10*3/uL (ref 1.4–7.7)
Neutrophils Relative %: 56.7 % (ref 43.0–77.0)
Platelets: 238 10*3/uL (ref 150.0–400.0)
RBC: 4.22 Mil/uL (ref 3.87–5.11)
RDW: 13.1 % (ref 11.5–15.5)
WBC: 7.6 10*3/uL (ref 4.0–10.5)

## 2021-09-25 LAB — LIPID PANEL
Cholesterol: 166 mg/dL (ref 0–200)
HDL: 54.7 mg/dL (ref >39.00)
LDL Cholesterol: 100 mg/dL — ABNORMAL HIGH (ref 0–99)
NonHDL: 111.74
Total CHOL/HDL Ratio: 3
Triglycerides: 59 mg/dL (ref 0.0–149.0)
VLDL: 11.8 mg/dL (ref 0.0–40.0)

## 2021-09-25 IMAGING — DX DG SHOULDER 2+V*R*
3 series · 3 of 3 positions shown · non-contrast
Comparison: None.

CLINICAL DATA: Right shoulder pain.

EXAM:
RIGHT SHOULDER - 2+ VIEW

[shoulder internal rotation ap]
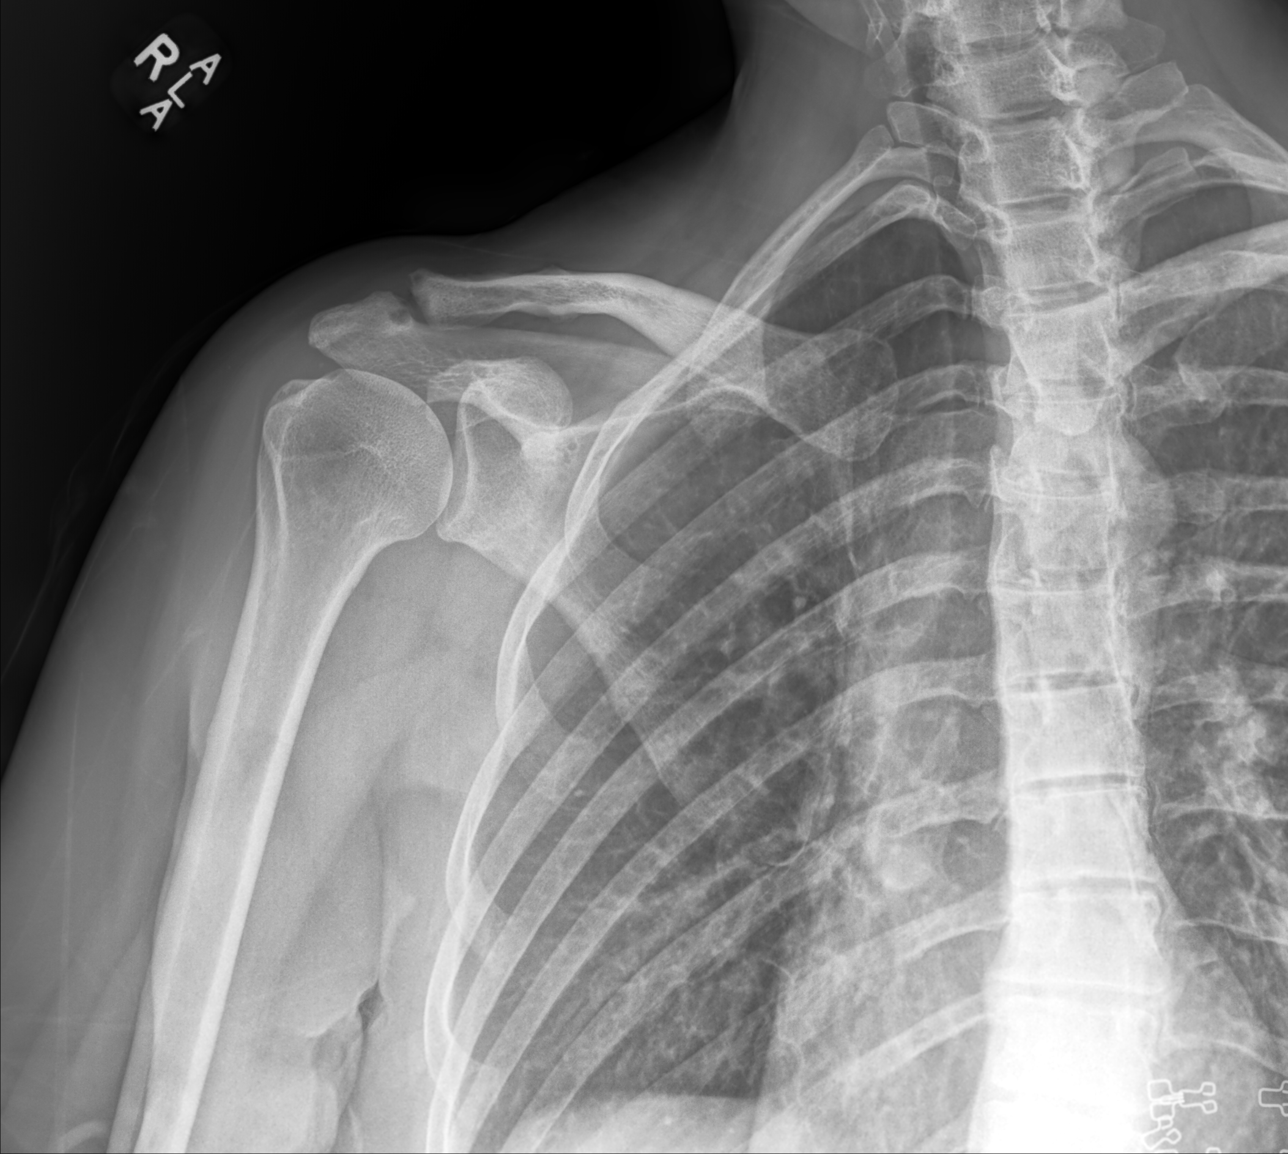

[shoulder (y view)]
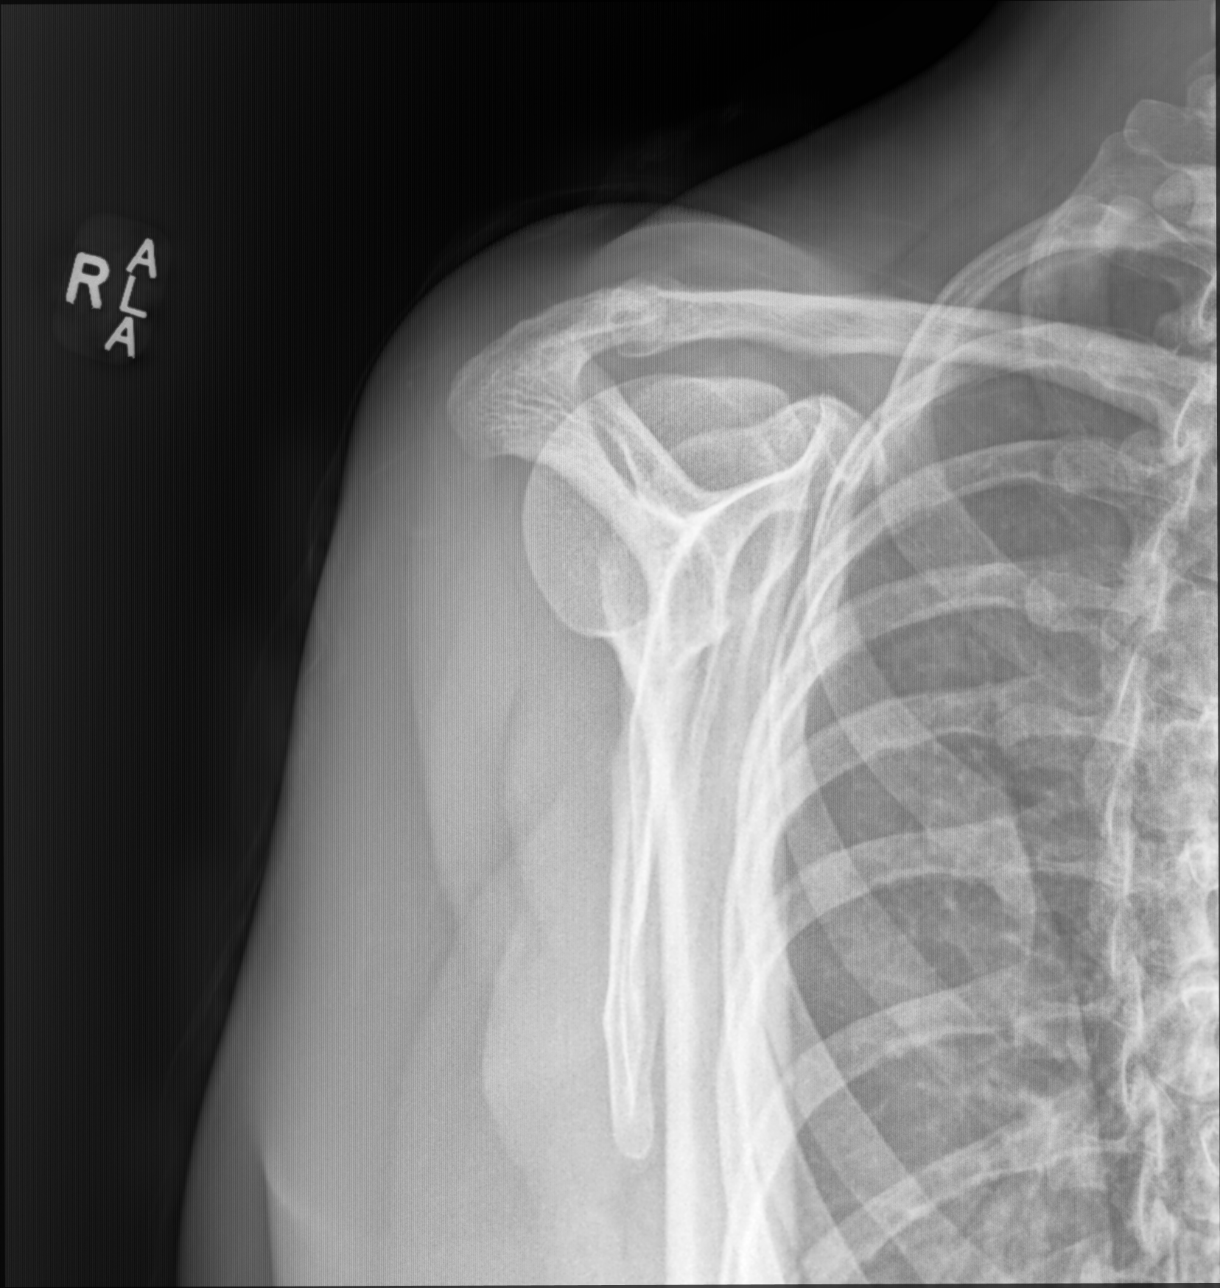

[shoulder (axial)]
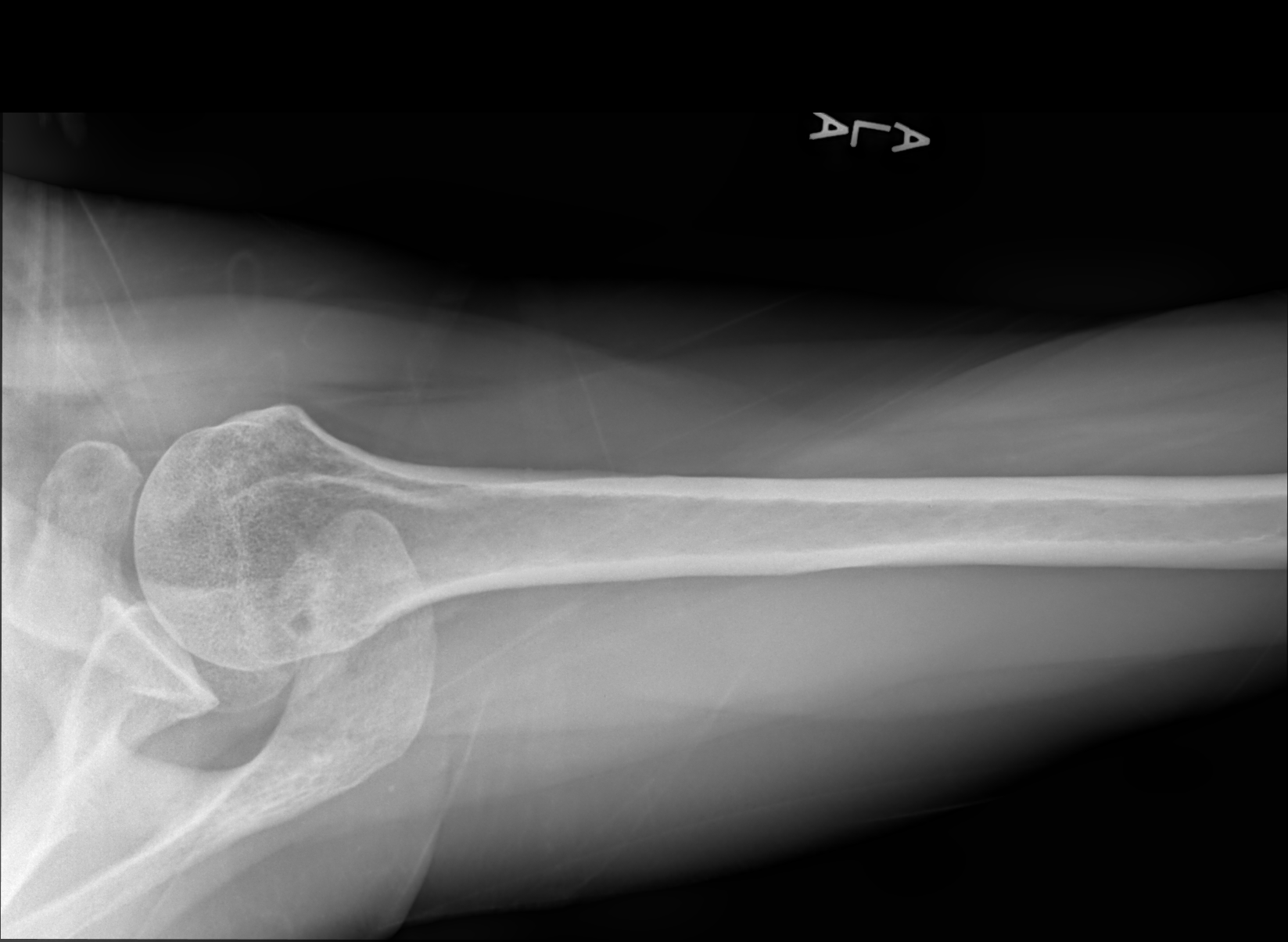

[3 of 3 positions shown; findings below may reference images not displayed]

FINDINGS: There is no evidence of fracture or dislocation. There is slight
spurring at the AC joint, and small subcortical cysts about both
sides of the joint. The glenohumeral joint is unremarkable.

The humeral cuff insertion is unremarkable. There is no narrowing of
the acromiohumeral space.

The visualized right upper lung field is clear. Soft tissues are
unremarkable.
IMPRESSION: Subcortical cysts and slight spurring at the AC joint. No acute
radiographic findings. No AC joint offset.

## 2021-09-25 NOTE — Patient Instructions (Signed)
Check with employee health re tetanus. Let us know if you need this. Suggest Tdap for booster.

## 2021-09-25 NOTE — Progress Notes (Signed)
Gloria Torres DOB: 19-May-1971 Encounter date: 09/25/2021  This is a 51 y.o. female who presents for complete physical   History of present illness/Additional concerns:  No worries today. Still gets cold sensation on legs that we discussed in past. Comes in bursts. She was told that it was coming from back, but was told no. Gets it on left cheek as well. Not more often. Gets on both legs all over. Sometimes left arm. Under left eye. No balance issues,no nausea.   Needs to get eyes checked. Feels that this is triggering some headaches on occasion. Frontal headache, not debilitating. Tylenol helps.   Doesn't follow with dermatology regularly.  Follows with Dr. Radene Knee for gyn care. Mammogram scheduled for 09/30/2021.  Takes prevacid intermittently for heartburn.   History reviewed. No pertinent past medical history. Past Surgical History:  Procedure Laterality Date   CESAREAN SECTION     TONSILLECTOMY  1992   Allergies  Allergen Reactions   Sulfa Antibiotics Hives   Erythromycin Rash   Current Meds  Medication Sig   albuterol (VENTOLIN HFA) 108 (90 Base) MCG/ACT inhaler Inhale 2 puffs into the lungs every 6 (six) hours as needed for wheezing or shortness of breath.   COVID-19 At Home Antigen Test ([facility] COVID-19 HOME TEST) KIT Use as directed   lansoprazole (PREVACID) 15 MG capsule    Social History   Tobacco Use   Smoking status: Never   Smokeless tobacco: Never  Substance Use Topics   Alcohol use: Yes    Comment: rare   Family History  Problem Relation Age of Onset   Memory loss Mother    Diabetes Father    Prostate cancer Father    Asthma Sister    Alzheimer's disease Brother 44   Colon cancer Maternal Grandmother 57   Lung cancer Maternal Grandfather    Hodgkin's lymphoma Paternal Grandmother    Heart failure Paternal Grandfather      Review of Systems  Constitutional:  Negative for activity change, appetite change, chills, fatigue, fever and unexpected  weight change.  HENT:  Negative for congestion, ear pain, hearing loss, sinus pressure, sinus pain, sore throat and trouble swallowing.   Eyes:  Negative for pain and visual disturbance.  Respiratory:  Negative for cough, chest tightness, shortness of breath and wheezing.   Cardiovascular:  Negative for chest pain, palpitations and leg swelling.  Gastrointestinal:  Negative for abdominal pain, blood in stool, constipation, diarrhea, nausea and vomiting.  Genitourinary:  Negative for difficulty urinating and menstrual problem.  Musculoskeletal:  Positive for arthralgias. Negative for back pain.  Skin:  Negative for rash.  Neurological:  Positive for numbness. Negative for dizziness, weakness and headaches.       On and off gets cold water sensation over limbs, face, bilat legs, left arm, left cheek.initially when started in legs we suspected back etiology due to also chronic back pain, but has continued/progressed.   Hematological:  Negative for adenopathy. Does not bruise/bleed easily.  Psychiatric/Behavioral:  Negative for sleep disturbance and suicidal ideas. The patient is not nervous/anxious.    CBC:  Lab Results  Component Value Date   WBC 5.7 03/20/2020   HGB 12.5 03/20/2020   HCT 38.1 03/20/2020   MCH 30.6 03/20/2020   MCHC 32.8 03/20/2020   RDW 13.2 03/20/2020   PLT 226 03/20/2020   MPV 11.2 03/20/2020   CMP: Lab Results  Component Value Date   NA 137 03/20/2020   K 4.5 03/20/2020   CL 104 03/20/2020  CO2 26 03/20/2020   GLUCOSE 82 03/20/2020   BUN 12 03/20/2020   CREATININE 0.78 03/20/2020   CALCIUM 9.0 03/20/2020   PROT 6.5 03/20/2020   BILITOT 0.5 03/20/2020   ALKPHOS 62 03/02/2019   ALT 18 03/20/2020   AST 20 03/20/2020   LIPID: Lab Results  Component Value Date   CHOL 217 (H) 03/20/2020   TRIG 131 03/20/2020   HDL 55 03/20/2020   LDLCALC 136 (H) 03/20/2020    Objective:  BP 104/62 (BP Location: Left Arm, Patient Position: Sitting, Cuff Size: Large)     Pulse (!) 58    Temp 98.1 F (36.7 C) (Oral)    Ht 5' 3.5" (1.613 m)    Wt 164 lb 11.2 oz (74.7 kg)    LMP 08/19/2021 (Exact Date)    SpO2 99%    BMI 28.72 kg/m   Weight: 164 lb 11.2 oz (74.7 kg)   BP Readings from Last 3 Encounters:  09/25/21 104/62  05/03/19 (!) 93/56  05/01/19 100/68   Wt Readings from Last 3 Encounters:  09/25/21 164 lb 11.2 oz (74.7 kg)  05/01/19 170 lb (77.1 kg)  03/19/19 164 lb 3.2 oz (74.5 kg)    Physical Exam Constitutional:      General: She is not in acute distress.    Appearance: She is well-developed.  HENT:     Head: Normocephalic and atraumatic.     Right Ear: External ear normal.     Left Ear: External ear normal.     Mouth/Throat:     Pharynx: No oropharyngeal exudate.  Eyes:     Conjunctiva/sclera: Conjunctivae normal.     Pupils: Pupils are equal, round, and reactive to light.  Neck:     Thyroid: No thyromegaly.  Cardiovascular:     Rate and Rhythm: Normal rate and regular rhythm.     Heart sounds: Normal heart sounds. No murmur heard.   No friction rub. No gallop.  Pulmonary:     Effort: Pulmonary effort is normal.     Breath sounds: Normal breath sounds.  Abdominal:     General: Bowel sounds are normal. There is no distension.     Palpations: Abdomen is soft. There is no mass.     Tenderness: There is no abdominal tenderness. There is no guarding.     Hernia: No hernia is present.  Musculoskeletal:        General: No tenderness or deformity. Normal range of motion.     Cervical back: Normal range of motion and neck supple.     Comments: She cannot "lift off" test even without resistance right shoulder. No other noted weakess. Pinching with hawkins, internal rotation. Full rom.   Lymphadenopathy:     Cervical: No cervical adenopathy.  Skin:    General: Skin is warm and dry.     Findings: No rash.  Neurological:     Mental Status: She is alert and oriented to person, place, and time.     Deep Tendon Reflexes: Reflexes normal.      Reflex Scores:      Tricep reflexes are 2+ on the right side and 2+ on the left side.      Bicep reflexes are 2+ on the right side and 2+ on the left side.      Brachioradialis reflexes are 2+ on the right side and 2+ on the left side.      Patellar reflexes are 2+ on the right side and 2+ on the left  side. Psychiatric:        Speech: Speech normal.        Behavior: Behavior normal.        Thought Content: Thought content normal.    Assessment/Plan: Health Maintenance Due  Topic Date Due   TETANUS/TDAP  Never done   COLONOSCOPY (Pts 45-66yr Insurance coverage will need to be confirmed)  Never done   Health Maintenance reviewed - she will check with employee health for hx of tdap.  1. Preventative health care Encouraged regular exercise.  There are activities that she is able to do that do not affect her right shoulder. - CBC with Differential/Platelet; Future - CBC with Differential/Platelet  2. Screening for colon cancer She did not follow through on prior referral from gynecologist.  Referral will be entered today since it had been closed. - Ambulatory referral to Gastroenterology  3. Neuropathy Intermittent nerve dysfunction of her legs, left arm, cheek.  This has been ongoing for last couple of years.  Negative prior rheumatologic work-up.  Although not specifically disruptive for her, it is not normal.  I think getting baseline MRI to make sure this is not coming from a neurologic source is reasonable.  Could also consider follow-up with neurology although the neurologic exam is normal today. - MR Brain Wo Contrast; Future  4. Right shoulder pain, unspecified chronicity Concern for subscapularis tear based on exam.  Start with x-ray and consider further evaluation pending this result. - DG Shoulder Right; Future  5. Lipid screening - Lipid panel; Future - Lipid panel  6. Screening for diabetes mellitus - Comprehensive metabolic panel; Future - Comprehensive  metabolic panel    Return in about 1 year (around 09/25/2022) for physical exam.  JMicheline Rough MD

## 2021-09-30 ENCOUNTER — Other Ambulatory Visit: Payer: Self-pay

## 2021-09-30 ENCOUNTER — Ambulatory Visit
Admission: RE | Admit: 2021-09-30 | Discharge: 2021-09-30 | Disposition: A | Discharge status: Home / Self Care | Payer: No Typology Code available for payment source | Source: Ambulatory Visit | Attending: Family Medicine | Admitting: Family Medicine

## 2021-09-30 DIAGNOSIS — Z1231 Encounter for screening mammogram for malignant neoplasm of breast: Secondary | ICD-10-CM

## 2021-09-30 IMAGING — MG MM DIGITAL SCREENING BILAT W/ TOMO AND CAD
8 series · 8 of 24 positions shown · non-contrast
Comparison: Previous exam(s).

CLINICAL DATA: Screening.

EXAM:
DIGITAL SCREENING BILATERAL MAMMOGRAM WITH TOMOSYNTHESIS AND CAD
TECHNIQUE: Bilateral screening digital craniocaudal and mediolateral oblique
mammograms were obtained. Bilateral screening digital breast
tomosynthesis was performed. The images were evaluated with
computer-aided detection.

[L MLO synth-2D]
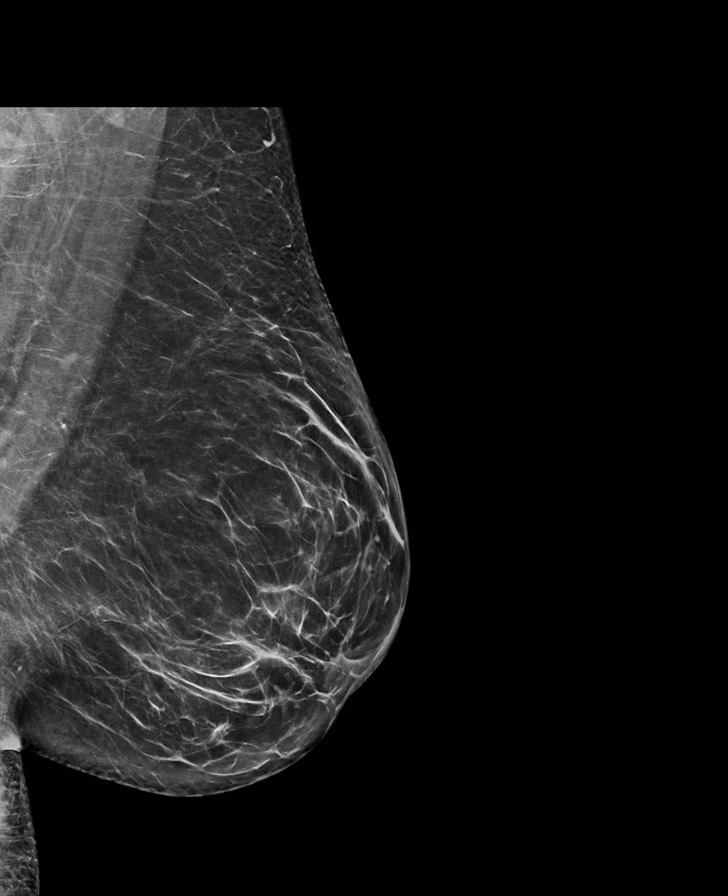

[R MLO synth-2D]
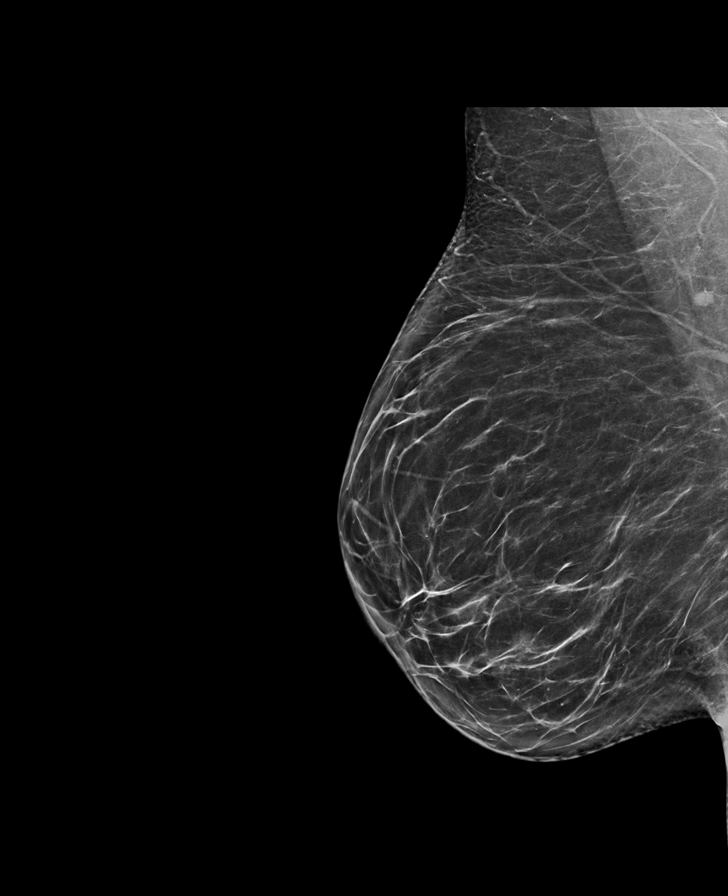

[R CC synth-2D]
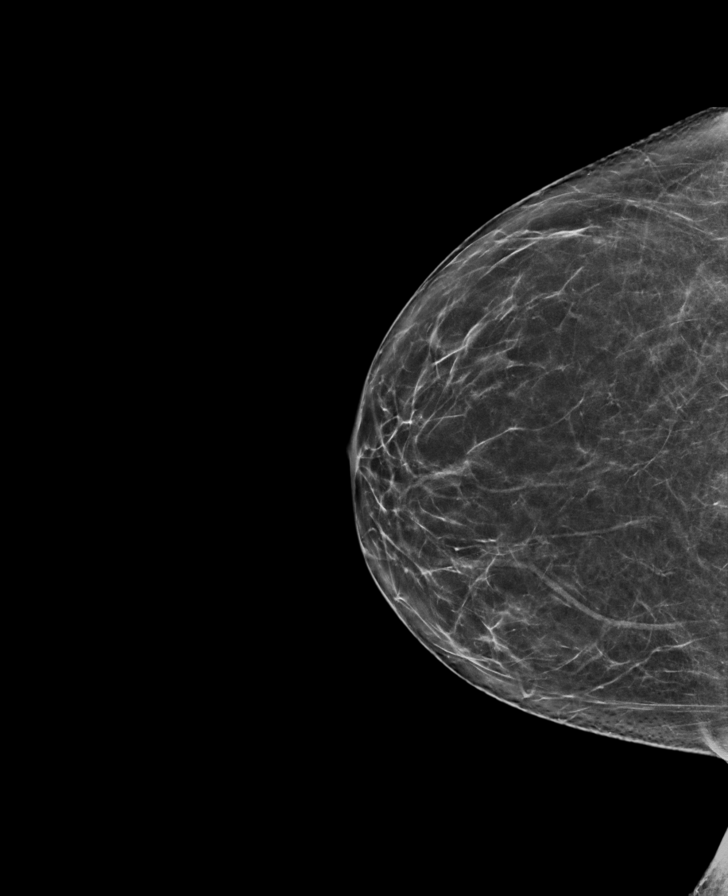

[L CC synth-2D]
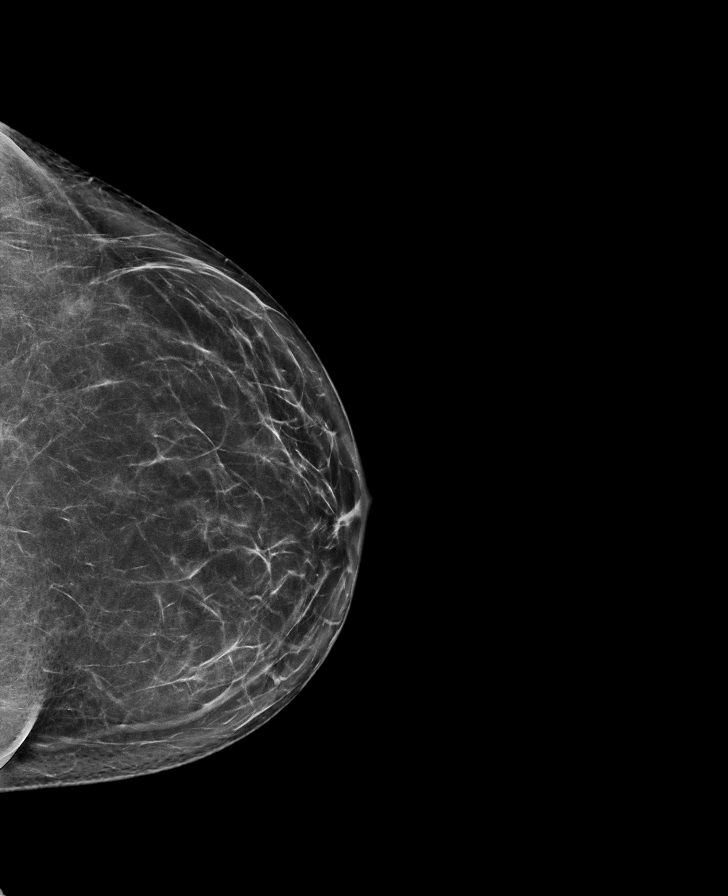

[R MLO tomo · tomo slice 41/80.0]
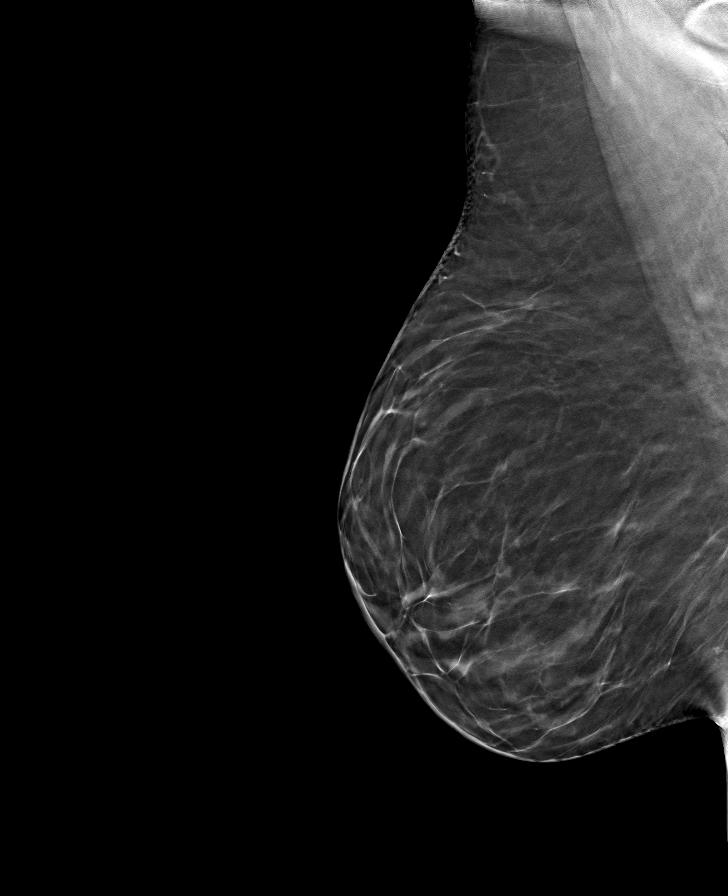

[L CC tomo · tomo slice 42/83.0]
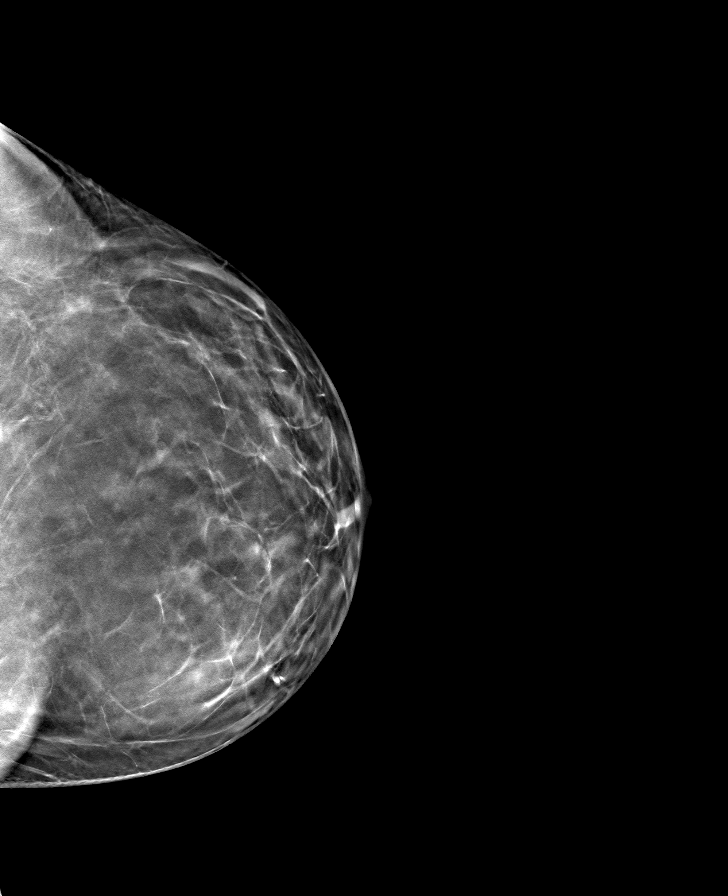

[L MLO tomo · tomo slice 45/88.0]
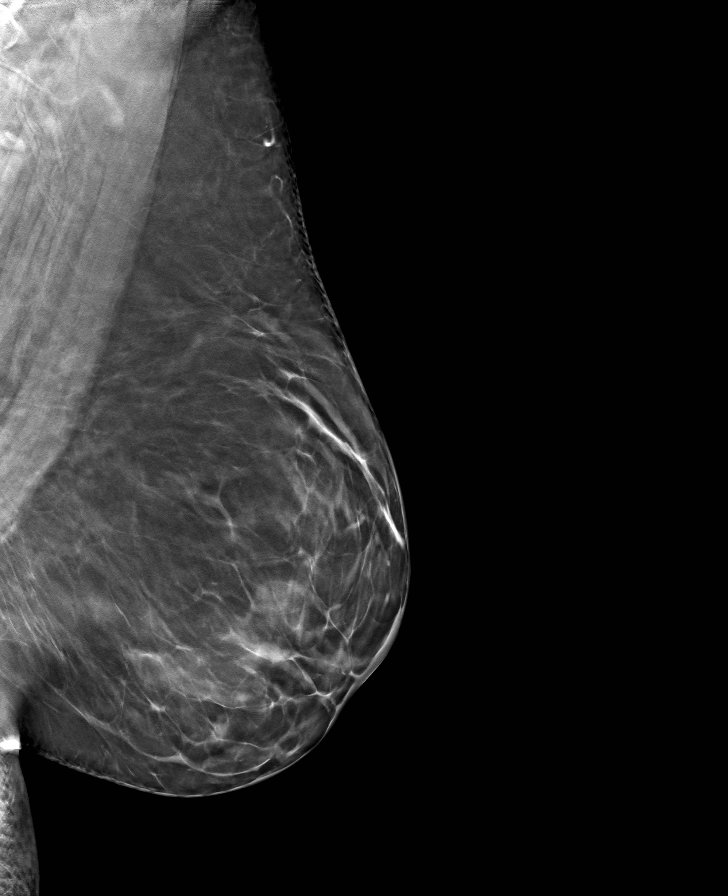

[R CC tomo · tomo slice 35/69.0]
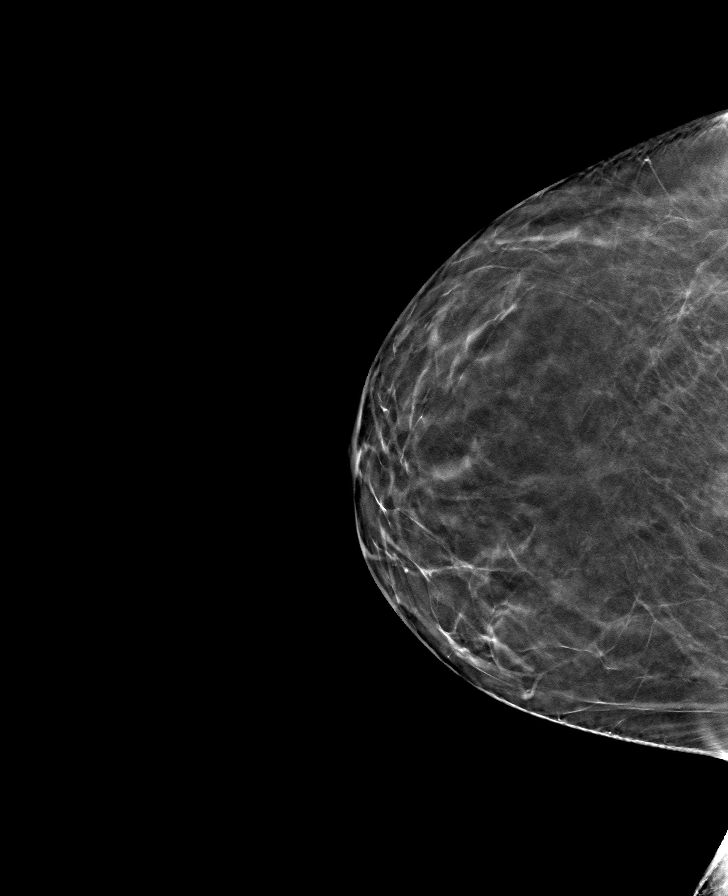

[8 of 24 positions shown; findings below may reference images not displayed]

ACR Breast Density Category b: There are scattered areas of
fibroglandular density.
FINDINGS: There are no findings suspicious for malignancy.
IMPRESSION: No mammographic evidence of malignancy. A result letter of this
screening mammogram will be mailed directly to the patient.

RECOMMENDATION:
Screening mammogram in one year. (Code:[BY])

BI-RADS CATEGORY  1: Negative.

## 2021-10-02 ENCOUNTER — Telehealth: Payer: Self-pay | Admitting: Family Medicine

## 2021-10-02 DIAGNOSIS — R2990 Unspecified symptoms and signs involving the nervous system: Secondary | ICD-10-CM

## 2021-10-02 DIAGNOSIS — R29898 Other symptoms and signs involving the musculoskeletal system: Secondary | ICD-10-CM

## 2021-10-02 NOTE — Telephone Encounter (Signed)
Patient called in requesting for both MRIs that Dr.Koberlein ordered to be sent to [facility] for patient to do it there.  Patient could be contacted at [phone].  Please advise.

## 2021-10-02 NOTE — Telephone Encounter (Signed)
Ok to re-send orders to [facility] long.

## 2021-10-05 ENCOUNTER — Emergency Department (INDEPENDENT_AMBULATORY_CARE_PROVIDER_SITE_OTHER)
Admission: EM | Admit: 2021-10-05 | Discharge: 2021-10-05 | Disposition: A | Discharge status: Home / Self Care | Payer: No Typology Code available for payment source | Source: Home / Self Care

## 2021-10-05 ENCOUNTER — Other Ambulatory Visit: Payer: Self-pay

## 2021-10-05 ENCOUNTER — Emergency Department (INDEPENDENT_AMBULATORY_CARE_PROVIDER_SITE_OTHER): Payer: No Typology Code available for payment source

## 2021-10-05 DIAGNOSIS — M5432 Sciatica, left side: Secondary | ICD-10-CM | POA: Diagnosis not present

## 2021-10-05 DIAGNOSIS — M62830 Muscle spasm of back: Secondary | ICD-10-CM

## 2021-10-05 DIAGNOSIS — S39012A Strain of muscle, fascia and tendon of lower back, initial encounter: Secondary | ICD-10-CM

## 2021-10-05 IMAGING — DX DG LUMBAR SPINE COMPLETE 4+V
5 series · 5 of 5 positions shown · non-contrast
Comparison: [DATE]

CLINICAL DATA: Lumbar strain. Pain in the LOWER LEFT flank since a
sneeze 4 days ago. Patient reports congenital spinal fusion of L2-3.

EXAM:
LUMBAR SPINE - COMPLETE 4+ VIEW

[l-spine ap]
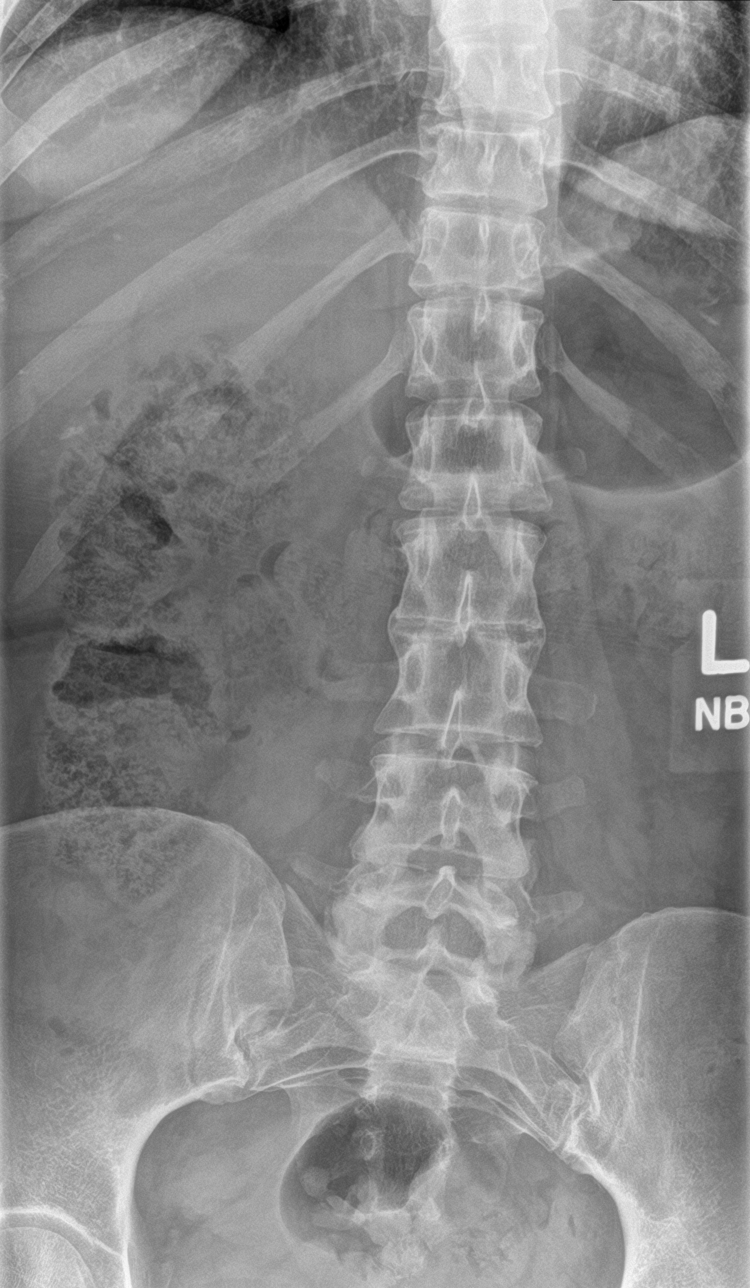

[l-spine obl (1 of 2)]
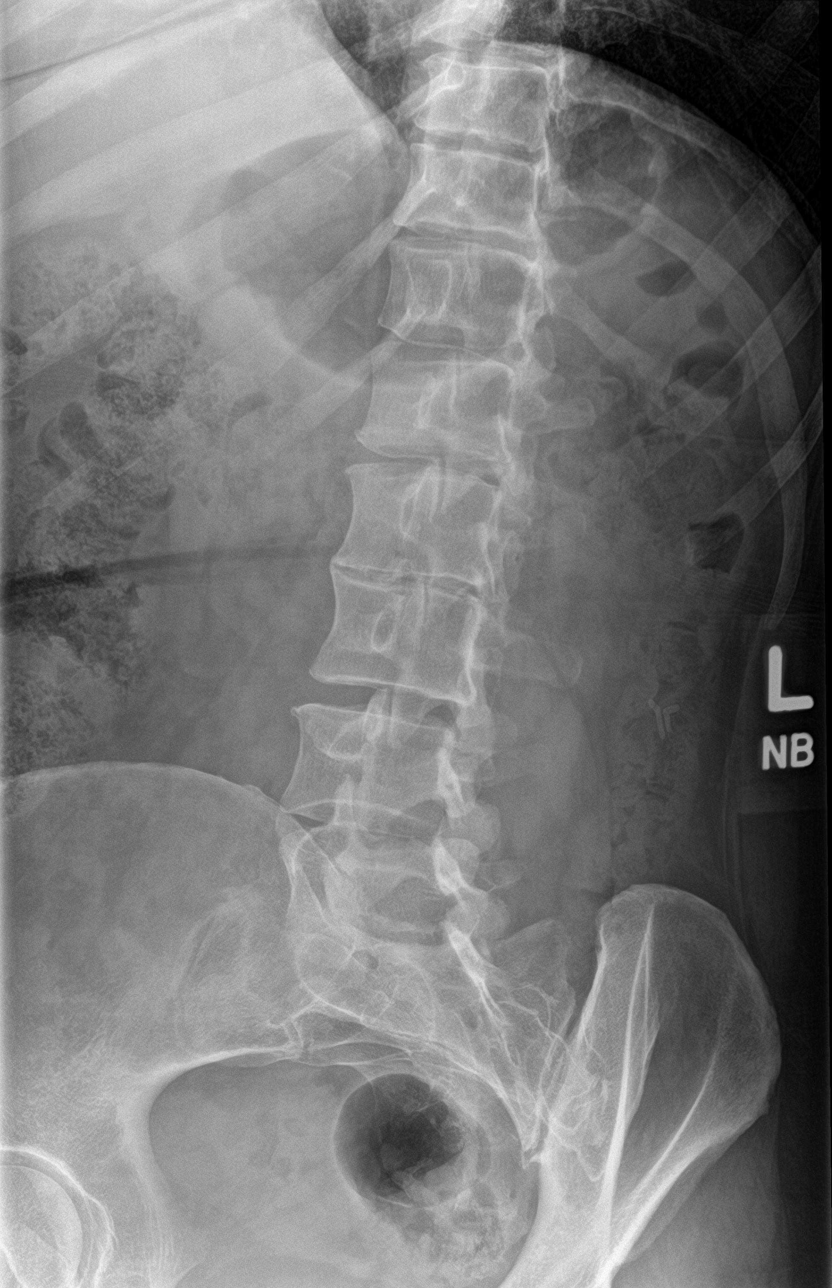

[l-spine obl (2 of 2)]
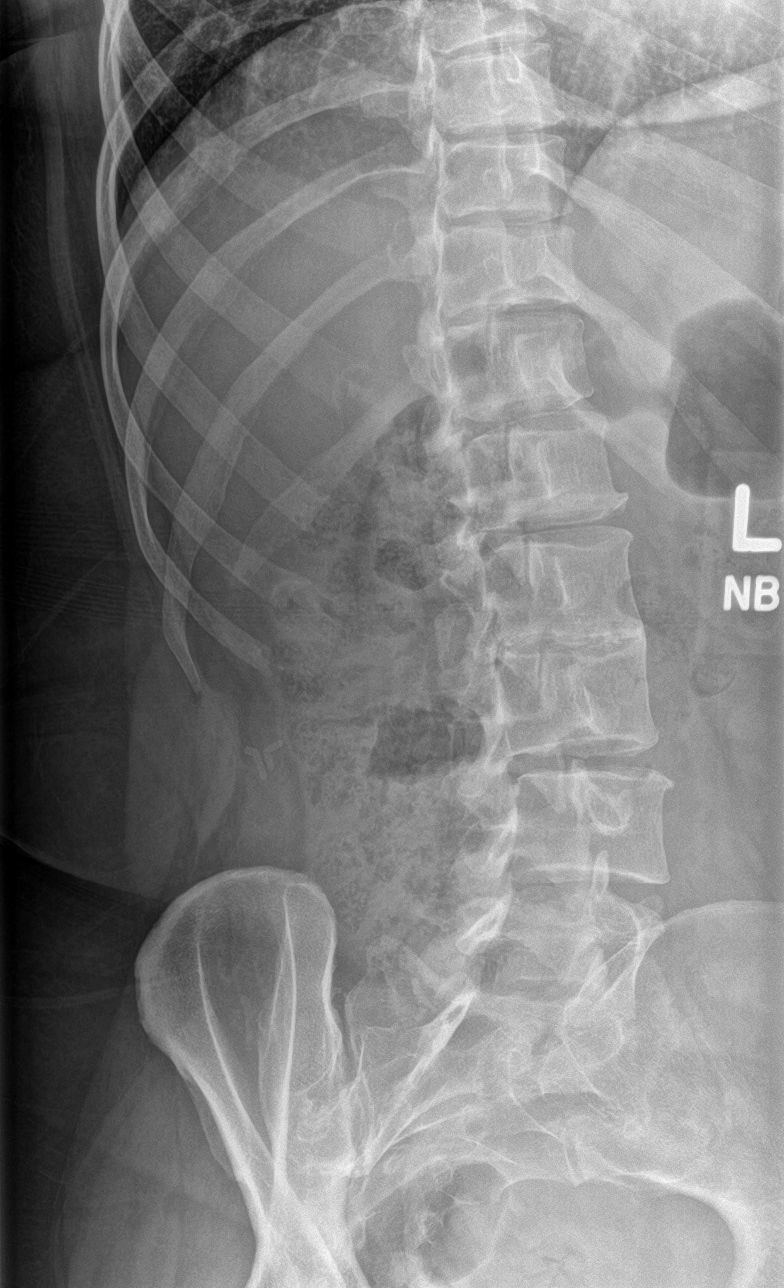

[l-spine lat]
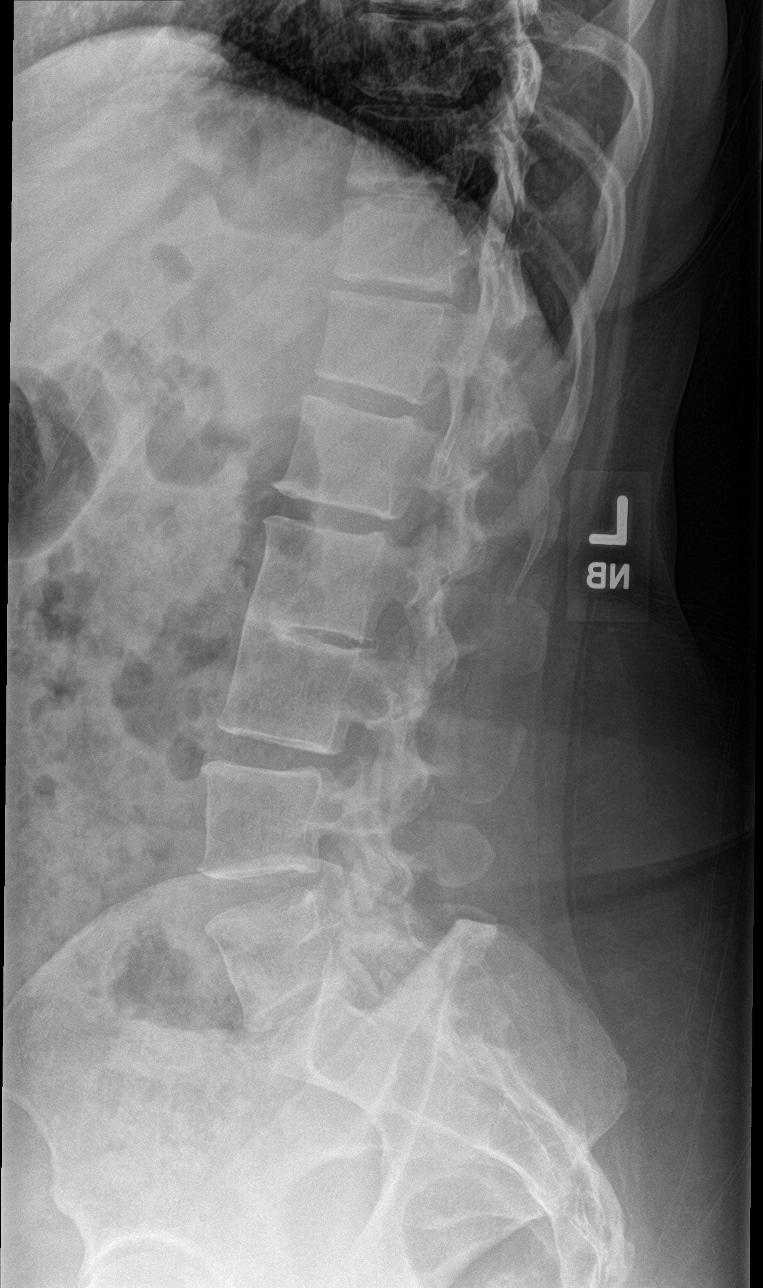

[l-spine spot]
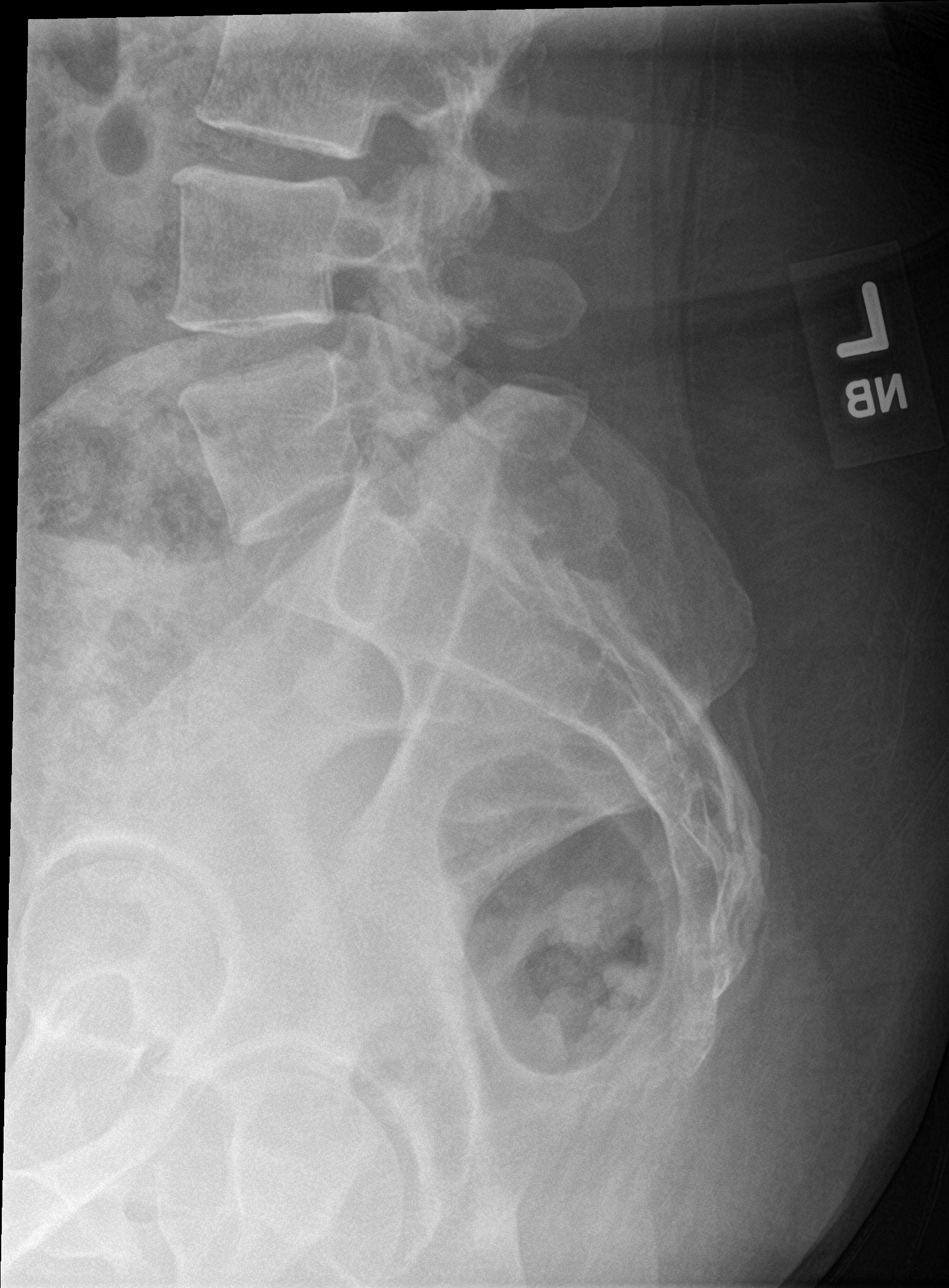

[5 of 5 positions shown; findings below may reference images not displayed]

FINDINGS: Normal alignment of the lumbar spine. Stable fusion of the anterior
aspect of L2 and L3 vertebral bodies. No acute fracture or
subluxation. Visualized bowel gas pattern is nonobstructed.
IMPRESSION: No evidence for acute  abnormality.

## 2021-10-05 MED ORDER — PREDNISONE 20 MG PO TABS: ORAL_TABLET | ORAL | 0 refills | Status: DC

## 2021-10-05 MED ORDER — BACLOFEN 10 MG PO TABS: 10.0000 mg | ORAL_TABLET | Freq: Three times a day (TID) | ORAL | 0 refills | Status: DC

## 2021-10-05 MED ORDER — METHYLPREDNISOLONE SODIUM SUCC 125 MG IJ SOLR
125.0000 mg | Freq: Once | INTRAMUSCULAR | Status: AC
  Administered 2021-10-05: 125 mg via INTRAMUSCULAR

## 2021-10-05 MED ORDER — KETOROLAC TROMETHAMINE 60 MG/2ML IM SOLN
60.0000 mg | Freq: Once | INTRAMUSCULAR | Status: AC
  Administered 2021-10-05: 60 mg via INTRAMUSCULAR

## 2021-10-05 NOTE — Discharge Instructions (Addendum)
Advised patient to take medication as directed with food to completion.  Instructed patient to start oral Prednisone taper tomorrow Tuesday, 10/06/21.  Advised may take Baclofen daily or as needed for accompanying muscle spasms.  Advised patient to avoid activities that could offend affected area of left lower back for the next 5 to 7 days.  Encouraged patient to increase daily water intake while taking these medications.

## 2021-10-05 NOTE — Telephone Encounter (Signed)
Orders re-entered as below and a detailed message was left at the patient's cell number.

## 2021-10-05 NOTE — ED Triage Notes (Signed)
Pt states that she has some back pain. X3 days   Pt states that she injured back a few years ago. Pt states that she leaned the wrong way and has been hurting ever since.

## 2021-10-05 NOTE — ED Provider Notes (Signed)
[facility]    CSN: [id number] Arrival date & time: 10/05/21  0806      History   Chief Complaint Chief Complaint  Patient presents with   Back Pain    Back pain x3 days    HPI Gloria Torres is a 51 y.o. female.   HPI 51 year old female presents with left-sided lower back pain for 3 days.  Patient denies injury or insult.  However, patient reports bending over her dishwasher yesterday that exacerbated pain in left lower back region.  PMH significant for lumbar back pain with radiculopathy affecting right lower extremity and spinal fusion of L2/L3.  History reviewed. No pertinent past medical history.  Patient Active Problem List   Diagnosis Date Noted   Lumbar back pain with radiculopathy affecting right lower extremity 07/17/2018    Past Surgical History:  Procedure Laterality Date   CESAREAN SECTION     TONSILLECTOMY  1992    OB History   No obstetric history on file.      Home Medications    Prior to Admission medications   Medication Sig Start Date End Date Taking? Authorizing Provider  baclofen (LIORESAL) 10 MG tablet Take 1 tablet (10 mg total) by mouth 3 (three) times daily. 10/05/21  Yes Eliezer Lofts, FNP  Famotidine (PEPCID PO) Take by mouth.   Yes [provider]  predniSONE (DELTASONE) 20 MG tablet Take 3 tabs PO daily x 3 days, then 2 tabs PO daily x 3 days, then 1 tab PO daily x 3 days 10/05/21  Yes Eliezer Lofts, FNP  albuterol (VENTOLIN HFA) 108 (90 Base) MCG/ACT inhaler Inhale 2 puffs into the lungs every 6 (six) hours as needed for wheezing or shortness of breath. 06/07/19   Dorothy Spark, MD  COVID-19 At Home Antigen Test [facility] COVID-19 HOME TEST) KIT Use as directed 12/11/20   Edmon Crape, RPH  lansoprazole (PREVACID) 15 MG capsule  10/15/19   [provider]    Family History Family History  Problem Relation Age of Onset   Memory loss Mother    Diabetes Father    Prostate cancer Father    Asthma  Sister    Colon cancer Maternal Grandmother 32   Lung cancer Maternal Grandfather    Hodgkin's lymphoma Paternal Grandmother    Heart failure Paternal Grandfather    Alzheimer's disease Brother 33   Breast cancer Neg Hx     Social History Social History   Tobacco Use   Smoking status: Never   Smokeless tobacco: Never  Vaping Use   Vaping Use: Never used  Substance Use Topics   Alcohol use: Not Currently    Comment: rare   Drug use: No     Allergies   Sulfa antibiotics and Erythromycin   Review of Systems Review of Systems  Musculoskeletal:  Positive for back pain.    Physical Exam Triage Vital Signs ED Triage Vitals [10/05/21 0821]  Enc Vitals Group     BP      Pulse      Resp      Temp      Temp src      SpO2      Weight 164 lb (74.4 kg)     Height 5' 2.5" (1.588 m)     Head Circumference      Peak Flow      Pain Score 7     Pain Loc      Pain Edu?  Excl. in [place]?    No data found.  Updated Vital Signs BP 117/82 (BP Location: Right Arm)    Pulse 84    Temp 98.5 F (36.9 C) (Oral)    Resp 18    Ht 5' 2.5" (1.588 m)    Wt 164 lb (74.4 kg)    LMP 10/02/2021 (Exact Date)    SpO2 99%    BMI 29.52 kg/m       Physical Exam Vitals and nursing note reviewed.  Constitutional:      General: She is not in acute distress.    Appearance: Normal appearance. She is obese. She is not ill-appearing.  HENT:     Head: Normocephalic and atraumatic.     Mouth/Throat:     Mouth: Mucous membranes are moist.     Pharynx: Oropharynx is clear.  Eyes:     Extraocular Movements: Extraocular movements intact.     Conjunctiva/sclera: Conjunctivae normal.     Pupils: Pupils are equal, round, and reactive to light.  Cardiovascular:     Rate and Rhythm: Normal rate and regular rhythm.     Pulses: Normal pulses.     Heart sounds: Normal heart sounds.  Pulmonary:     Effort: Pulmonary effort is normal.     Breath sounds: Normal breath sounds.  Musculoskeletal:         General: Normal range of motion.     Cervical back: Normal range of motion and neck supple.     Comments: Left-sided lumbar spine: TTP over left-sided paraspinous muscles, inferior spinal erectors   Skin:    General: Skin is warm and dry.  Neurological:     General: No focal deficit present.     Mental Status: She is alert and oriented to person, place, and time.     UC Treatments / Results  Labs (all labs ordered are listed, but only abnormal results are displayed) Labs Reviewed - No data to display  EKG   Radiology DG Lumbar Spine Complete  Result Date: 10/05/2021 CLINICAL DATA:  Lumbar strain. Pain in the LOWER LEFT flank since a sneeze 4 days ago. 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 EXAM: LUMBAR SPINE - COMPLETE 4+ VIEW COMPARISON:  01/08/2020 FINDINGS: Normal alignment of the lumbar spine. Stable fusion of the anterior aspect of L2 and L3 vertebral bodies. No acute fracture or subluxation. Visualized bowel gas pattern is nonobstructed. IMPRESSION: No evidence for acute  abnormality. Electronically Signed   By: Nolon Nations M.D.   On: 10/05/2021 09:05    Procedures Procedures (including critical care time)  Medications Ordered in UC Medications  methylPREDNISolone sodium succinate (SOLU-MEDROL) 125 mg/2 mL injection 125 mg (125 mg Intramuscular Given 10/05/21 0937)  ketorolac (TORADOL) injection 60 mg (60 mg Intramuscular Given 10/05/21 0937)    Initial Impression / Assessment and Plan / UC Course  I have reviewed the triage vital signs and the nursing notes.  Pertinent labs & imaging results that were available during my care of the patient were reviewed by me and considered in my medical decision making (see chart for details).     MDM: 1.  Sciatica of left side-IM Solu-Medrol 125 mg and Toradol 60 mg given once in clinic; 2.  Strain of lumbar region, initial encounter, x-ray of LS spine revealed stable fusion of the anterior aspect of L2 and L3, no  acute fracture or subluxation, Rx'd prednisone taper; 3.  Muscle spasm of back-Rx Baclofen. Advised patient to take medication as directed  with food to completion.  Instructed patient to start oral Prednisone taper tomorrow Tuesday, 10/06/21.  Advised may take Baclofen daily or as needed for accompanying muscle spasms.  Advised patient to avoid activities that could offend affected area of left lower back for the next 5 to 7 days.  Encouraged patient to increase daily water intake while taking these medications. Final Clinical Impressions(s) / UC Diagnoses   Final diagnoses:  Strain of lumbar region, initial encounter  Muscle spasm of back  Sciatica of left side     Discharge Instructions      Advised patient to take medication as directed with food to completion.  Instructed patient to start oral Prednisone taper tomorrow Tuesday, 10/06/21.  Advised may take Baclofen daily or as needed for accompanying muscle spasms.  Advised patient to avoid activities that could offend affected area of left lower back for the next 5 to 7 days.  Encouraged patient to increase daily water intake while taking these medications.     ED Prescriptions     Medication Sig Dispense Auth. Provider   predniSONE (DELTASONE) 20 MG tablet Take 3 tabs PO daily x 3 days, then 2 tabs PO daily x 3 days, then 1 tab PO daily x 3 days 18 tablet Eliezer Lofts, FNP   baclofen (LIORESAL) 10 MG tablet Take 1 tablet (10 mg total) by mouth 3 (three) times daily. 38 each Eliezer Lofts, FNP      PDMP not reviewed this encounter.   Eliezer Lofts, [place] 10/05/21 [phone]

## 2021-10-14 NOTE — Telephone Encounter (Signed)
Spoke with the patient and informed her a message was sent to [facility], referral coordinator that is assisting our office today.  I advised the patient Gloria Torres will work on this and someone will contact her once this is completed.  I advised the patient it may be in her best interest to reschedule the appt and she agreed. ?

## 2021-10-14 NOTE — Telephone Encounter (Signed)
Patient called in stating that her appt is for 9am tomorrow morning but no one has did anything to her referral. Patient stated that insurance advised her to speak with provider or nurse. ? ?Please advise. ?

## 2021-10-15 ENCOUNTER — Ambulatory Visit (HOSPITAL_COMMUNITY)
Admission: RE | Admit: 2021-10-15 | Discharge: 2021-10-15 | Disposition: A | Discharge status: Home / Self Care | Payer: No Typology Code available for payment source | Source: Ambulatory Visit | Attending: Family Medicine | Admitting: Family Medicine

## 2021-10-15 ENCOUNTER — Other Ambulatory Visit: Payer: Self-pay

## 2021-10-15 DIAGNOSIS — R29898 Other symptoms and signs involving the musculoskeletal system: Secondary | ICD-10-CM

## 2021-10-15 DIAGNOSIS — R2990 Unspecified symptoms and signs involving the nervous system: Secondary | ICD-10-CM | POA: Diagnosis present

## 2021-10-15 IMAGING — MR MR SHOULDER*R* W/O CM
6 series · 40 of 40 positions shown · non-contrast
Comparison: X-ray [DATE]

CLINICAL DATA: Right shoulder pain. Clinical concern for
subscapularis tear

EXAM:
MRI OF THE RIGHT SHOULDER WITHOUT CONTRAST
TECHNIQUE: Multiplanar, multisequence MR imaging of the shoulder was performed.
No intravenous contrast was administered.

[Series 6: T2 fat-sat · axial · right · 4.0mm · 0.62mm/px · z∈[-42,+65]mm · 7 of 28 slices shown (1 of 4)]
[im 1/28]
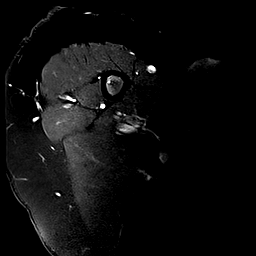
[im 5/28]
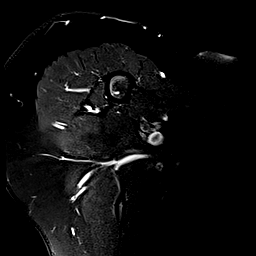
[im 10/28]
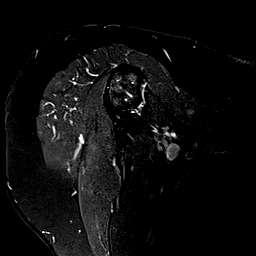
[im 14/28]
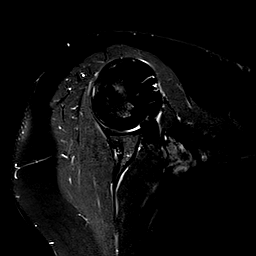
[im 19/28]
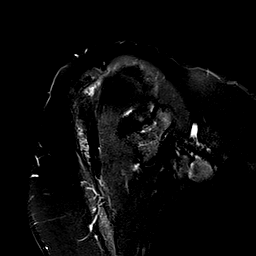
[im 23/28]
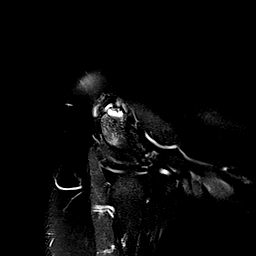
[im 28/28]
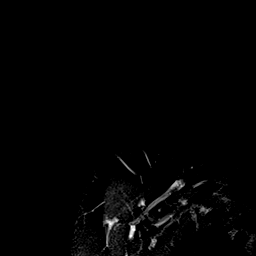

[Series 7: PD · oblique · right · 4.0mm · 0.50mm/px · 5 of 20 slices shown]
[im 1/20]
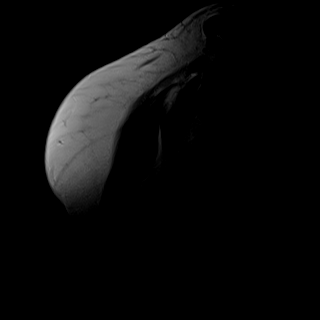
[im 5/20]
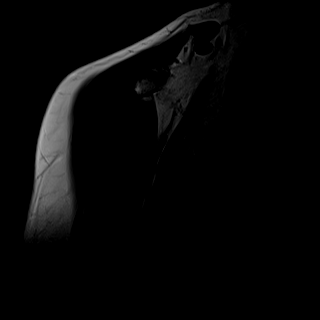
[im 10/20]
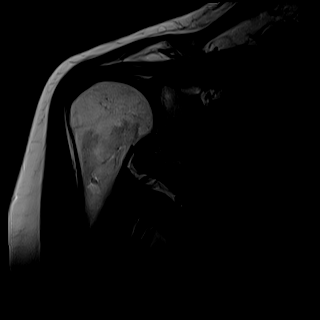
[im 15/20]
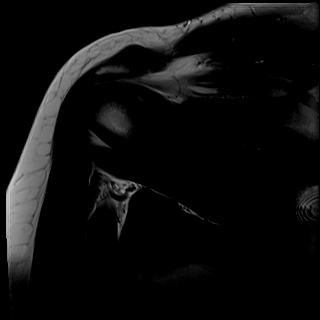
[im 20/20]
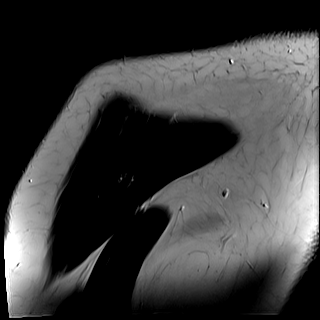

[Series 8: T2 fat-sat · oblique · right · 4.0mm · 0.50mm/px · 6 of 20 slices shown (2 of 4)]
[im 1/20]
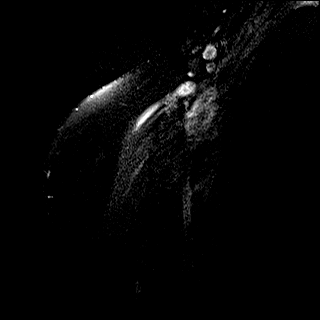
[im 4/20]
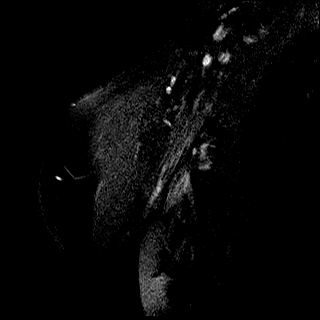
[im 8/20]
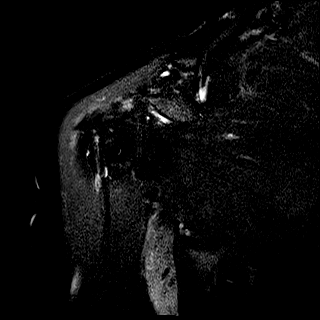
[im 12/20]
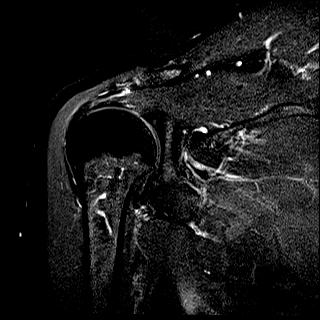
[im 16/20]
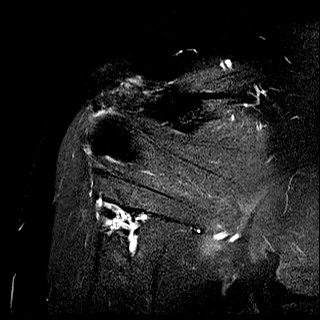
[im 20/20]
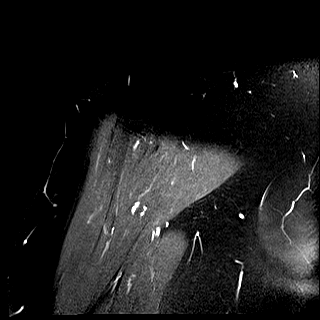

[Series 9: T1 · oblique · right · 3.0mm · 0.59mm/px · 8 of 28 slices shown]
[im 1/28]
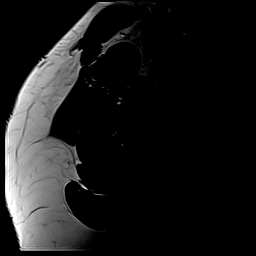
[im 4/28]
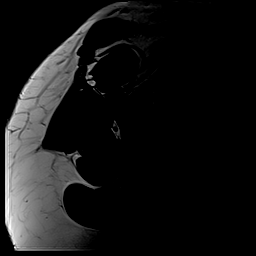
[im 8/28]
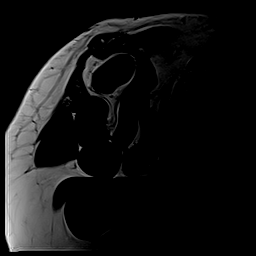
[im 12/28]
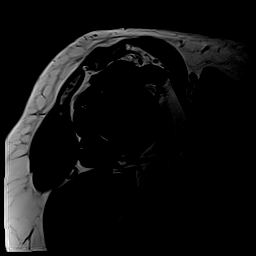
[im 16/28]
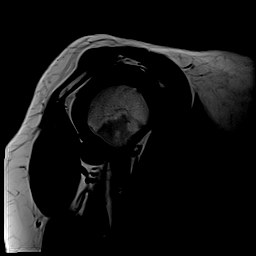
[im 20/28]
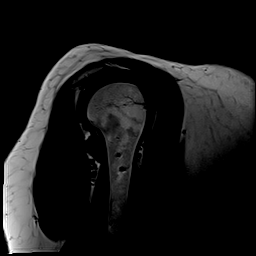
[im 24/28]
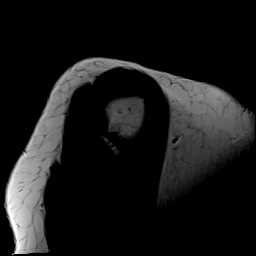
[im 28/28]
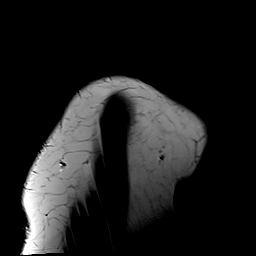

[Series 10: T2 fat-sat · oblique · right · 3.0mm · 0.59mm/px · 8 of 28 slices shown (3 of 4)]
[im 1/28]
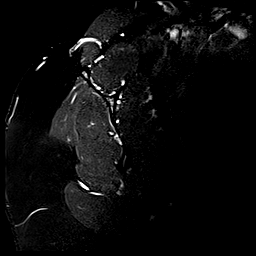
[im 4/28]
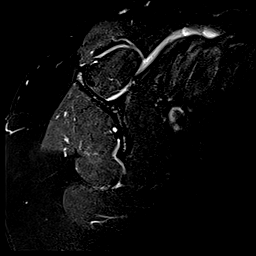
[im 8/28]
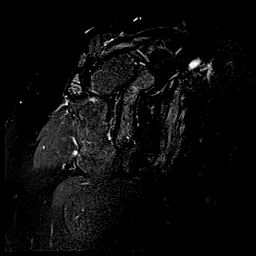
[im 12/28]
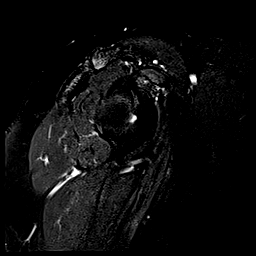
[im 16/28]
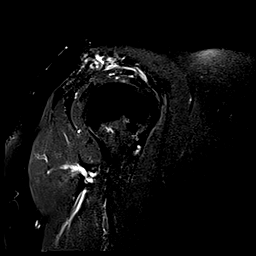
[im 20/28]
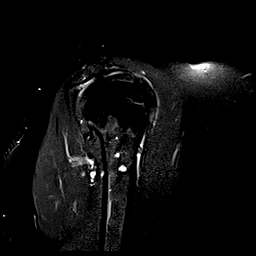
[im 24/28]
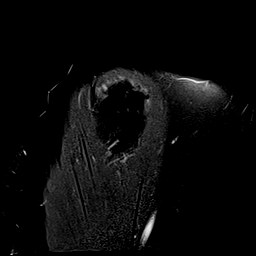
[im 28/28]
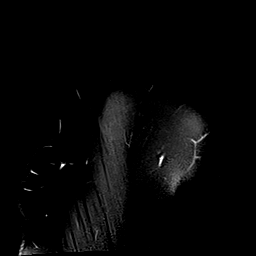

[Series 11: T2 fat-sat · oblique · right · 4.0mm · 0.50mm/px · 6 of 20 slices shown (4 of 4)]
[im 1/20]
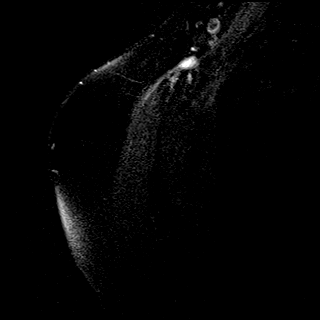
[im 4/20]
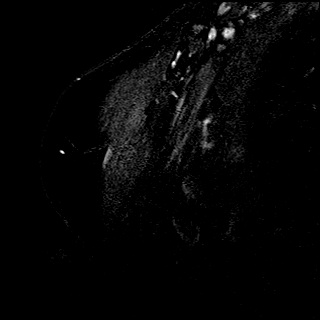
[im 8/20]
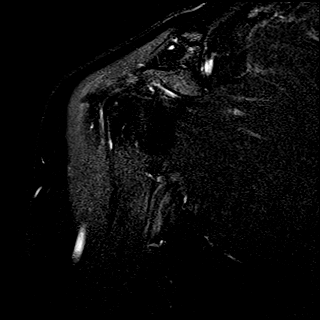
[im 12/20]
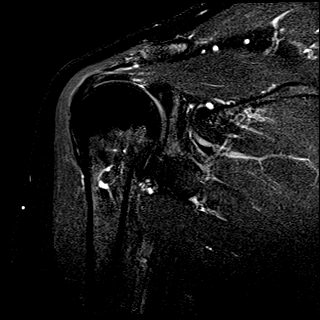
[im 16/20]
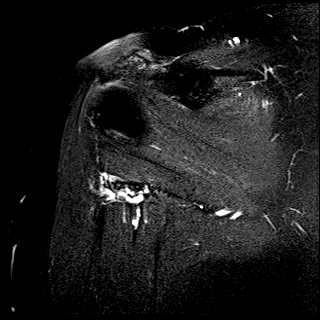
[im 20/20]
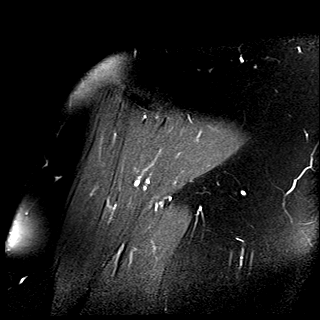

[40 of 40 positions shown; findings below may reference images not displayed]

FINDINGS: Rotator cuff: Mild-moderate tendinosis of the supraspinatus,
infraspinatus, and subscapularis. Teres minor tendon intact. No
rotator cuff tear.

Muscles: Preserved bulk and signal intensity of the rotator cuff
musculature without edema, atrophy, or fatty infiltration.

Biceps long head:  Intact and appropriately positioned.

Acromioclavicular Joint: Mild degenerative changes of the AC joint.
Trace subacromial-subdeltoid bursal fluid.

Glenohumeral Joint: No joint effusion. No cartilage defect.

Labrum: Grossly intact although evaluation is limited in the absence
of intra-articular fluid. No paralabral cyst.

Bones: No acute fracture. No dislocation. No bone marrow edema. No
marrow replacing bone lesion.

Other: None.
IMPRESSION: 1. Mild-moderate rotator cuff tendinosis without tear.
2. Mild degenerative changes of the AC joint.

## 2021-10-15 NOTE — Telephone Encounter (Signed)
Teams message was received from [place] stating the MRI was approved: Josem Kaufmann #[id number] eff 3/2- 3/30 per Coralyn Mark M.at [facility] phone [phone].  Spoke with the patient and informed her of this also.  Patient questioned if this was for both and I advised she contact her insurance and she declined. ? ?

## 2021-10-20 ENCOUNTER — Telehealth: Payer: Self-pay | Admitting: Family Medicine

## 2021-10-20 ENCOUNTER — Other Ambulatory Visit: Payer: Self-pay | Admitting: *Deleted

## 2021-10-20 DIAGNOSIS — J349 Unspecified disorder of nose and nasal sinuses: Secondary | ICD-10-CM

## 2021-10-20 DIAGNOSIS — R29898 Other symptoms and signs involving the musculoskeletal system: Secondary | ICD-10-CM

## 2021-10-20 NOTE — Telephone Encounter (Signed)
Pt is calling back and she is aware md/nurse is  seeing pts etc ?

## 2021-10-20 NOTE — Telephone Encounter (Signed)
Pt call and stated she is returning your call and want a call back. 

## 2021-10-20 NOTE — Telephone Encounter (Signed)
See results note. 

## 2021-10-21 ENCOUNTER — Ambulatory Visit (HOSPITAL_COMMUNITY): Payer: No Typology Code available for payment source

## 2021-10-21 ENCOUNTER — Ambulatory Visit (HOSPITAL_COMMUNITY): Admission: RE | Admit: 2021-10-21 | Payer: No Typology Code available for payment source | Source: Ambulatory Visit

## 2021-10-26 ENCOUNTER — Encounter: Payer: Self-pay | Admitting: Family Medicine

## 2021-10-26 DIAGNOSIS — G5792 Unspecified mononeuropathy of left lower limb: Secondary | ICD-10-CM

## 2021-10-26 DIAGNOSIS — R2990 Unspecified symptoms and signs involving the nervous system: Secondary | ICD-10-CM

## 2021-10-28 ENCOUNTER — Encounter: Payer: Self-pay | Admitting: Gastroenterology

## 2021-10-28 ENCOUNTER — Encounter: Payer: Self-pay | Admitting: Neurology

## 2021-11-23 ENCOUNTER — Ambulatory Visit (AMBULATORY_SURGERY_CENTER): Payer: No Typology Code available for payment source

## 2021-11-23 VITALS — Ht 63.5 in | Wt 160.0 lb

## 2021-11-23 DIAGNOSIS — Z1211 Encounter for screening for malignant neoplasm of colon: Secondary | ICD-10-CM

## 2021-11-23 MED ORDER — NA SULFATE-K SULFATE-MG SULF 17.5-3.13-1.6 GM/177ML PO SOLN: 1.0000 | Freq: Once | ORAL | 0 refills | Status: AC

## 2021-11-23 NOTE — Progress Notes (Signed)
No egg or soy allergy known to patient  ?No issues known to pt with past sedation with any surgeries or procedures ?Patient denies ever being told they had issues or difficulty with intubation  ?No FH of Malignant Hyperthermia ?Pt is not on diet pills ?Pt is not on  home 02  ?Pt is not on blood thinners  ?Pt denies issues with constipation -only has bm twice a week.  Encouraged patient to take daily miralax dose the week prior to procedure.  Patient verbalized understanding. ?No A fib or A flutter ? ?PV completed over the phone. Pt verified name, DOB, address and insurance during PV today.  ?Pt mailed instruction packet with copy of consent form to read and not return, and instructions.  ?Pt encouraged to call with questions or issues.  ?If pt has My chart, procedure instructions sent via My Chart  ? ?

## 2021-12-07 ENCOUNTER — Encounter: Payer: Self-pay | Admitting: Gastroenterology

## 2021-12-14 ENCOUNTER — Encounter: Payer: Self-pay | Admitting: Gastroenterology

## 2021-12-14 ENCOUNTER — Ambulatory Visit (AMBULATORY_SURGERY_CENTER): Payer: No Typology Code available for payment source | Admitting: Gastroenterology

## 2021-12-14 VITALS — BP 91/52 | HR 59 | Temp 97.7°F | Resp 10 | Ht 63.0 in | Wt 160.0 lb

## 2021-12-14 DIAGNOSIS — Z1211 Encounter for screening for malignant neoplasm of colon: Secondary | ICD-10-CM | POA: Diagnosis not present

## 2021-12-14 DIAGNOSIS — D120 Benign neoplasm of cecum: Secondary | ICD-10-CM | POA: Diagnosis not present

## 2021-12-14 DIAGNOSIS — D123 Benign neoplasm of transverse colon: Secondary | ICD-10-CM

## 2021-12-14 DIAGNOSIS — K635 Polyp of colon: Secondary | ICD-10-CM | POA: Diagnosis not present

## 2021-12-14 MED ORDER — SODIUM CHLORIDE 0.9 % IV SOLN: 500.0000 mL | Freq: Once | INTRAVENOUS | Status: DC

## 2021-12-14 NOTE — Progress Notes (Signed)
? ?  Referring Provider: Caren Macadam, MD ?Primary Care Physician:  Caren Macadam, MD ? ?Indication for Procedure:  Colon cancer screening ? ? ?IMPRESSION:  ?Need for colon cancer screening ?Appropriate candidate for monitored anesthesia care ? ?PLAN: ?Colonoscopy in the [facility] today ? ? ?HPI: Gloria Torres is a 51 y.o. female presents for screening colonoscopy. ? ?No prior colonoscopy or colon cancer screening. ? ?No baseline GI symptoms.  ? ?No known family history of colon cancer or polyps. No family history of uterine/endometrial cancer, pancreatic cancer or gastric/stomach cancer. ? ? ?Past Medical History:  ?Diagnosis Date  ? GERD (gastroesophageal reflux disease)   ? ? ?Past Surgical History:  ?Procedure Laterality Date  ? CESAREAN SECTION    ? TONSILLECTOMY  1992  ? ? ?Current Outpatient Medications  ?Medication Sig Dispense Refill  ? Famotidine (PEPCID PO) Take by mouth.    ? ?Current Facility-Administered Medications  ?Medication Dose Route Frequency Provider Last Rate Last Admin  ? 0.9 %  sodium chloride infusion  500 mL Intravenous Once Thornton Park, MD      ? ? ?Allergies as of 12/14/2021 - Review Complete 12/14/2021  ?Allergen Reaction Noted  ? Sulfa antibiotics Hives 02/03/2012  ? Erythromycin Rash 08/21/2014  ? ? ?Family History  ?Problem Relation Age of Onset  ? Memory loss Mother   ? Diabetes Father   ? Prostate cancer Father   ? Asthma Sister   ? Alzheimer's disease Brother 14  ? Colon cancer Maternal Grandmother 80  ? Lung cancer Maternal Grandfather   ? Hodgkin's lymphoma Paternal Grandmother   ? Heart failure Paternal Grandfather   ? Breast cancer Neg Hx   ? Colon polyps Neg Hx   ? Esophageal cancer Neg Hx   ? Stomach cancer Neg Hx   ? Rectal cancer Neg Hx   ? ? ? ?Physical Exam: ?General:   Alert,  well-nourished, pleasant and cooperative in NAD ?Head:  Normocephalic and atraumatic. ?Eyes:  Sclera clear, no icterus.   Conjunctiva pink. ?Mouth:  No deformity or lesions.   ?Neck:   Supple; no masses or thyromegaly. ?Lungs:  Clear throughout to auscultation.   No wheezes. ?Heart:  Regular rate and rhythm; no murmurs. ?Abdomen:  Soft, non-tender, nondistended, normal bowel sounds, no rebound or guarding.  ?Msk:  Symmetrical. No boney deformities ?LAD: No inguinal or umbilical LAD ?Extremities:  No clubbing or edema. ?Neurologic:  Alert and  oriented x4;  grossly nonfocal ?Skin:  No obvious rash or bruise. ?Psych:  Alert and cooperative. Normal mood and affect. ? ? ? ? ?Studies/Results: ?No results found. ? ? ? ?[name] L. Tarri Glenn, MD, MPH ?12/14/2021, 1:07 PM ? ? ? ?  ?

## 2021-12-14 NOTE — Progress Notes (Signed)
PT taken to PACU. Monitors in place. VSS. Report given to RN. 

## 2021-12-14 NOTE — Patient Instructions (Signed)
Thank you for coming to see [place] today! ?Resume previous diet and medications today, return to normal daily activities tomorrow. ?Biopsy results available in 7-10 days.  Will make recommendation for next colonoscopy at that time. ? ? ? ? ?YOU HAD AN ENDOSCOPIC PROCEDURE TODAY AT [facility]:   Refer to the procedure report that was given to you for any specific questions about what was found during the examination.  If the procedure report does not answer your questions, please call your gastroenterologist to clarify.  If you requested that your care partner not be given the details of your procedure findings, then the procedure report has been included in a sealed envelope for you to review at your convenience later. ? ?YOU SHOULD EXPECT: Some feelings of bloating in the abdomen. Passage of more gas than usual.  Walking can help get rid of the air that was put into your GI tract during the procedure and reduce the bloating. If you had a lower endoscopy (such as a colonoscopy or flexible sigmoidoscopy) you may notice spotting of blood in your stool or on the toilet paper. If you underwent a bowel prep for your procedure, you may not have a normal bowel movement for a few days. ? ?Please Note:  You might notice some irritation and congestion in your nose or some drainage.  This is from the oxygen used during your procedure.  There is no need for concern and it should clear up in a day or so. ? ?SYMPTOMS TO REPORT IMMEDIATELY: ? ?Following lower endoscopy (colonoscopy or flexible sigmoidoscopy): ? Excessive amounts of blood in the stool ? Significant tenderness or worsening of abdominal pains ? Swelling of the abdomen that is new, acute ? Fever of 100?F or higher ? ? ?For urgent or emergent issues, a gastroenterologist can be reached at any hour by calling [phone]. ?Do not use MyChart messaging for urgent concerns.  ? ? ?DIET:  We do recommend a small meal at first, but then you may proceed to  your regular diet.  Drink plenty of fluids but you should avoid alcoholic beverages for 24 hours. ? ?ACTIVITY:  You should plan to take it easy for the rest of today and you should NOT DRIVE or use heavy machinery until tomorrow (because of the sedation medicines used during the test).   ? ?FOLLOW UP: ?Our staff will call the number listed on your records 48-72 hours following your procedure to check on you and address any questions or concerns that you may have regarding the information given to you following your procedure. If we do not reach you, we will leave a message.  We will attempt to reach you two times.  During this call, we will ask if you have developed any symptoms of COVID 19. If you develop any symptoms (ie: fever, flu-like symptoms, shortness of breath, cough etc.) before then, please call [phone].  If you test positive for Covid 19 in the 2 weeks post procedure, please call and report this information to [place].   ? ?If any biopsies were taken you will be contacted by phone or by letter within the next 1-3 weeks.  Please call us at [phone] if you have not heard about the biopsies in 3 weeks.  ? ? ?SIGNATURES/CONFIDENTIALITY: ?You and/or your care partner have signed paperwork which will be entered into your electronic medical record.  These signatures attest to the fact that that the information above on your After Visit Summary has  been reviewed and is understood.  Full responsibility of the confidentiality of this discharge information lies with you and/or your care-partner.  ?

## 2021-12-14 NOTE — Progress Notes (Signed)
Pt's states no medical or surgical changes since previsit or office visit. 

## 2021-12-14 NOTE — Op Note (Signed)
[facility] ?Patient Name: Gloria Torres ?Procedure Date: 12/14/2021 1:12 PM ?MRN: [id number] ?Endoscopist: Thornton Park MD, MD ?Age: 51 ?Referring MD:  ?Date of Birth: Jan 09, 1971 ?Gender: Female ?Account #: [id number] ?Procedure:                Colonoscopy ?Indications:              Screening for colorectal malignant neoplasm, This  ?                          is the patient's first colonoscopy ?                          No known family history of colon cancer or polyps ?Medicines:                Monitored Anesthesia Care ?Procedure:                Pre-Anesthesia Assessment: ?                          - Prior to the procedure, a History and Physical  ?                          was performed, and patient medications and  ?                          allergies were reviewed. The patient's tolerance of  ?                          previous anesthesia was also reviewed. The risks  ?                          and benefits of the procedure and the sedation  ?                          options and risks were discussed with the patient.  ?                          All questions were answered, and informed consent  ?                          was obtained. Prior Anticoagulants: The patient has  ?                          taken no previous anticoagulant or antiplatelet  ?                          agents. ASA Grade Assessment: II - A patient with  ?                          mild systemic disease. After reviewing the risks  ?                          and benefits, the patient was deemed in  ?  satisfactory condition to undergo the procedure. ?                          After obtaining informed consent, the colonoscope  ?                          was passed under direct vision. Throughout the  ?                          procedure, the patient's blood pressure, pulse, and  ?                          oxygen saturations were monitored continuously. The  ?                          CF [id number] #[id number] was  introduced through the anus  ?                          and advanced to the 3 cm into the ileum. The  ?                          colonoscopy was performed without difficulty. The  ?                          patient tolerated the procedure well. The quality  ?                          of the bowel preparation was good. The terminal  ?                          ileum, ileocecal valve, appendiceal orifice, and  ?                          rectum were photographed. ?Scope In: 1:18:25 PM ?Scope Out: 1:33:11 PM ?Scope Withdrawal Time: 0 hours 10 minutes 16 seconds  ?Total Procedure Duration: 0 hours 14 minutes 46 seconds  ?Findings:                 The perianal and digital rectal examinations were  ?                          normal. ?                          A 2-3 mm polyp was found in the hepatic flexure.  ?                          The polyp was sessile. The polyp was removed with a  ?                          cold snare. Resection and retrieval were complete.  ?                          Estimated blood loss was minimal. ?  A 3-4 mm polyp was found in the cecum. The polyp  ?                          was sessile. The polyp was removed with a cold  ?                          snare. Resection and retrieval were complete.  ?                          Estimated blood loss was minimal. ?                          The exam was otherwise without abnormality on  ?                          direct and retroflexion views. ?Complications:            No immediate complications. ?Estimated Blood Loss:     Estimated blood loss was minimal. ?Impression:               - One 2-3 mm polyp at the hepatic flexure, removed  ?                          with a cold snare. Resected and retrieved. ?                          - One 3-4 mm polyp in the cecum, removed with a  ?                          cold snare. Resected and retrieved. ?                          - The examination was otherwise normal on direct  ?                           and retroflexion views. ?Recommendation:           - Patient has a contact number available for  ?                          emergencies. The signs and symptoms of potential  ?                          delayed complications were discussed with the  ?                          patient. Return to normal activities tomorrow.  ?                          Written discharge instructions were provided to the  ?                          patient. ?                          -  Resume previous diet. ?                          - Continue present medications. ?                          - Await pathology results. ?                          - Repeat colonoscopy date to be determined after  ?                          pending pathology results are reviewed for  ?                          surveillance. ?                          - Emerging evidence supports eating a diet of  ?                          fruits, vegetables, grains, calcium, and yogurt  ?                          while reducing red meat and alcohol may reduce the  ?                          risk of colon cancer. ?                          - Thank you for allowing me to be involved in your  ?                          colon cancer prevention. ?Thornton Park MD, MD ?12/14/2021 1:40:05 PM ?This report has been signed electronically. ?

## 2021-12-16 ENCOUNTER — Telehealth: Payer: Self-pay

## 2021-12-16 NOTE — Telephone Encounter (Signed)
?  Follow up Call- ? ? ?  12/14/2021  ? 12:49 PM  ?Call back number  ?Post procedure Call Back phone  # [phone]  ?Permission to leave phone message Yes  ?  ? ?Patient questions: ? ?Do you have a fever, pain , or abdominal swelling? No. ?Pain Score  0 * ? ?Have you tolerated food without any problems? Yes.   ? ?Have you been able to return to your normal activities? Yes.   ? ?Do you have any questions about your discharge instructions: ?Diet   No. ?Medications  No. ?Follow up visit  No. ? ?Do you have questions or concerns about your Care? No. ? ?Actions: ?* If pain score is 4 or above: ?No action needed, pain <4. ? ? ?

## 2021-12-17 ENCOUNTER — Encounter: Payer: Self-pay | Admitting: Gastroenterology

## 2022-02-19 NOTE — Progress Notes (Addendum)
NEUROLOGY CONSULTATION NOTE  Gloria Torres MRN: [id number] DOB: 1971-03-16  Referring provider: Micheline Rough, MD Primary care provider: Earl Lagos, MD  Reason for consult:  sensory disturbance  Assessment/Plan:   Tingling/sensory abnormalities of body - neurologic exam and workup so far are unremarkable.  Given associated hyperreflexia, will check spinal cord lesion.  Not typical pattern for a peripheral neuropathy but will evaluate.   Hyperreflexia - may be physiologic/normal for her.  She does not exhibit pathologic reflexes such as Hoffman or Babinski sign.  But given sensory deficits, will evaluate for spinal cord lesion  1  Check MRI cervical spine with and without contrast to evaluate for spinal cord lesion 2  If MRI unremarkable, NCV-EMG to evaluate for a peripheral neuropathy  06/29/2022 ADDENDUM:  MRI of cervical spine with and without contrast on 04/05/2022 mild degenerative changes of the spine but no significant spinal canal stenosis or spinal cord abnormality.  NCV-EMG on 06/28/2022 showed evidence of chronic left L4-L5 radiculopathy which is incidental finding but otherwise unremarkable without explanation for her specific sensory symptoms.  Unfortunately, I have no neurologic explanation or further recommendations.    Metta Clines, DO    Subjective:  Gloria Torres is a 51 year old right-handed who presents for sensory disturbance.  History supplemented by referring provider's notes.  Symptoms have been occurring over past couple of years.  She reports sensations around her body (mostly in both legs) but also occasionally torso and left cheek - a paroxysmal or lingering cold sensation, such as touching cold metal or droplet sensation.  Occasional tingling sensation up and down legs but no numbness.  Frequency varies from daily or less frequent.  Not painful.  Every now and then, she has a sensation of a bug bite on her right foot.  Sometimes her right hand is shaky,  sometimes when using the hand (such as holding a fork or painting her toenails) but not at rest.  This occurs infrequently.  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  Follow up lumbar X-ay on 10/05/2021 for back pain revealed stable fusion of the anterior aspect of L2 and L3 vertebral bodies but otherwise unremarkable.  MRI of brain with and without contrast on 10/15/2021 personally reviewed showed scattered nonspecific punctate hyperintense FLAIR foci within the subcortical white matter.  She also endorsed right shoulder pain.  MRI of right shoulder on 10/15/2021 showed mild to moderate rotator cuff tendinosis without tear and mild degenerative changes of the [facility] join.  Autoimmune workup in August 2021 was unremarkable, including ANA, RF, sed rate, CRP, HLA-B27 antigen, and cyclic citrul peptide antibody.  B12 was 444 and TSH was 1.90.   Her brother (62 years old) was diagnosed with early-onset Alzheimer's.  Otherwise, no family history of neurologic diseases.     PAST MEDICAL HISTORY: Past Medical History:  Diagnosis Date   GERD (gastroesophageal reflux disease)     PAST SURGICAL HISTORY: Past Surgical History:  Procedure Laterality Date   CESAREAN SECTION     TONSILLECTOMY  1992    MEDICATIONS: Current Outpatient Medications on File Prior to Visit  Medication Sig Dispense Refill  Famotidine (PEPCID PO) Take by mouth.     No current facility-administered medications on file prior to visit.    ALLERGIES: Allergies  Allergen Reactions   Sulfa Antibiotics Hives   Erythromycin Rash    FAMILY  HISTORY: Family History  Problem Relation Age of Onset   Memory loss Mother    Diabetes Father    Prostate cancer Father    Asthma Sister    Alzheimer's disease Brother 29   Colon cancer Maternal Grandmother 14   Lung cancer Maternal Grandfather    Hodgkin's lymphoma Paternal Grandmother    Heart failure Paternal Grandfather    Breast cancer Neg Hx    Colon polyps Neg Hx    Esophageal cancer Neg Hx    Stomach cancer Neg Hx    Rectal cancer Neg Hx     Objective:  Blood pressure (!) 84/53, pulse 62, height 5' 2.5" (1.588 m), weight 169 lb (76.7 kg), SpO2 99 %. General: No acute distress.  Patient appears well-groomed.   Head:  Normocephalic/atraumatic Eyes:  fundi examined but not visualized Neck: supple, no paraspinal tenderness, full range of motion Back: No paraspinal tenderness Heart: regular rate and rhythm Lungs: Clear to auscultation bilaterally. Vascular: No carotid bruits. Neurological Exam: Mental status: alert and oriented to person, place, and time, speech fluent and not dysarthric, language intact. Cranial nerves: CN I: not tested CN II: pupils equal, round and reactive to light, visual fields intact CN III, IV, VI:  full range of motion, no nystagmus, no ptosis CN V: facial sensation intact. CN VII: upper and lower face symmetric CN VIII: hearing intact CN IX, X: gag intact, uvula midline CN XI: sternocleidomastoid and trapezius muscles intact CN XII: tongue midline Bulk & Tone: normal, no fasciculations. Motor:  muscle strength 5/5 throughout Sensation:  Pinprick and vibratory sensation intact. Deep Tendon Reflexes:  3+ throughout except in ankles,  toes downgoing.   Finger to nose testing:  Without dysmetria.   Heel to shin:  Without dysmetria.   Gait:  Normal station and stride.  Able to turn and tandem walk.  Romberg negative.    Thank you for allowing me to take part in the care of this patient.  Metta Clines, DO  CC: Micheline Rough,  MD

## 2022-02-22 ENCOUNTER — Encounter: Payer: Self-pay | Admitting: Neurology

## 2022-02-22 ENCOUNTER — Ambulatory Visit (INDEPENDENT_AMBULATORY_CARE_PROVIDER_SITE_OTHER): Payer: No Typology Code available for payment source | Admitting: Neurology

## 2022-02-22 VITALS — BP 84/53 | HR 62 | Ht 62.5 in | Wt 169.0 lb

## 2022-02-22 DIAGNOSIS — R292 Abnormal reflex: Secondary | ICD-10-CM

## 2022-02-22 DIAGNOSIS — R202 Paresthesia of skin: Secondary | ICD-10-CM | POA: Diagnosis not present

## 2022-02-22 NOTE — Patient Instructions (Signed)
We will check MRI of cervical spine with and without contrast We will check nerve conduction study of right arm and leg Further recommendations pending results.

## 2022-03-30 ENCOUNTER — Other Ambulatory Visit: Payer: No Typology Code available for payment source

## 2022-04-05 ENCOUNTER — Ambulatory Visit
Admission: RE | Admit: 2022-04-05 | Discharge: 2022-04-05 | Disposition: A | Discharge status: Home / Self Care | Payer: No Typology Code available for payment source | Source: Ambulatory Visit | Attending: Neurology | Admitting: Neurology

## 2022-04-05 MED ORDER — GADOBENATE DIMEGLUMINE 529 MG/ML IV SOLN
15.0000 mL | Freq: Once | INTRAVENOUS | Status: AC | PRN
  Administered 2022-04-05: 15 mL via INTRAVENOUS

## 2022-05-10 ENCOUNTER — Encounter: Payer: No Typology Code available for payment source | Admitting: Neurology

## 2022-05-17 ENCOUNTER — Encounter: Payer: Self-pay | Admitting: Family Medicine

## 2022-05-17 ENCOUNTER — Ambulatory Visit (INDEPENDENT_AMBULATORY_CARE_PROVIDER_SITE_OTHER): Payer: No Typology Code available for payment source | Admitting: Family Medicine

## 2022-05-17 DIAGNOSIS — K219 Gastro-esophageal reflux disease without esophagitis: Secondary | ICD-10-CM

## 2022-05-17 DIAGNOSIS — H538 Other visual disturbances: Secondary | ICD-10-CM

## 2022-05-17 DIAGNOSIS — R202 Paresthesia of skin: Secondary | ICD-10-CM | POA: Diagnosis not present

## 2022-05-17 NOTE — Progress Notes (Signed)
Established Patient Office Visit  Subjective   Patient ID: Gloria Torres, female    DOB: 02/29/1971  Age: 50 y.o. MRN: [id number]  Chief Complaint  Patient presents with   Establish Care    Patient reports the extremity sensation and in the left face is still present-- states it feels like a wet or cold sensation. Has been seeing Dr. Tomi Likens for this, had a work up that included MRI of the brain and cervical spine. States that she is having an EMG done. States she often will have blurry vision in the morning, not sure if the two sensations are related. States it is intermittent, once every 2 weeks.    Current Outpatient Medications  Medication Instructions   Famotidine (PEPCID PO) 20 mg, Oral, 2 times daily    Patient Active Problem List   Diagnosis Date Noted   GERD without esophagitis 05/17/2022   Paresthesia of skin 05/17/2022   Blurry vision, bilateral 05/17/2022   Lumbar back pain with radiculopathy affecting right lower extremity 07/17/2018      Review of Systems  All other systems reviewed and are negative.     Objective:     BP 102/80 (BP Location: Right Arm, Patient Position: Sitting, Cuff Size: Normal)   Pulse 72   Temp 98.3 F (36.8 C) (Oral)   Ht 5' 2.5" (1.588 m)   Wt 169 lb 8 oz (76.9 kg)   LMP 05/12/2022 (Exact Date)   SpO2 98%   BMI 30.51 kg/m    Physical Exam Vitals reviewed.  Constitutional:      Appearance: Normal appearance. She is well-groomed and normal weight.  HENT:     Head: Normocephalic and atraumatic.  Eyes:     Extraocular Movements: Extraocular movements intact.     Conjunctiva/sclera: Conjunctivae normal.  Neck:     Thyroid: No thyromegaly.  Cardiovascular:     Rate and Rhythm: Normal rate and regular rhythm.     Heart sounds: S1 normal and S2 normal.  Pulmonary:     Effort: Pulmonary effort is normal.     Breath sounds: Normal breath sounds and air entry.  Abdominal:     General: Bowel sounds are normal.  Musculoskeletal:      Cervical back: Normal range of motion.  Skin:    General: Skin is warm and dry.  Neurological:     Mental Status: She is alert and oriented to person, place, and time. Mental status is at baseline.     Gait: Gait is intact.  Psychiatric:        Mood and Affect: Mood and affect normal.        Speech: Speech normal.        Behavior: Behavior normal.        Judgment: Judgment normal.      No results found for any visits on 05/17/22.    The 10-year ASCVD risk score (Arnett DK, et al., 2019) is: 0.6%    Assessment & Plan:   Problem List Items Addressed This Visit       Digestive   GERD without esophagitis    Patient reports stable sx on the BID 20 mg pepcid, pt gets this OTC, will continue.         Other   Paresthesia of skin    Continuing ongoing work up with neurology. I reviewed her labs and MRI's she had recently. No significant findings have ben discovered that explain her symptoms at this time. EMG is to be  performed soon. Will continue to follow.      Blurry vision, bilateral    Intermittent, pt states it is happening in the morning only, about once every 2 weeks or so. We discussed that this may not be related to the paresthesias. If her symptoms persist or worsen then I recommend referral to optometry or ophthalmology for vision evaluation       Return in about 19 weeks (around 09/27/2022) for Annual physical Exam.    Farrel Conners, MD

## 2022-05-17 NOTE — Assessment & Plan Note (Signed)
Patient reports stable sx on the BID 20 mg pepcid, pt gets this OTC, will continue.

## 2022-05-17 NOTE — Assessment & Plan Note (Signed)
Continuing ongoing work up with neurology. I reviewed her labs and MRI's she had recently. No significant findings have ben discovered that explain her symptoms at this time. EMG is to be performed soon. Will continue to follow.

## 2022-05-17 NOTE — Assessment & Plan Note (Signed)
Intermittent, pt states it is happening in the morning only, about once every 2 weeks or so. We discussed that this may not be related to the paresthesias. If her symptoms persist or worsen then I recommend referral to optometry or ophthalmology for vision evaluation

## 2022-06-28 ENCOUNTER — Ambulatory Visit: Payer: No Typology Code available for payment source | Admitting: Neurology

## 2022-06-28 DIAGNOSIS — M5417 Radiculopathy, lumbosacral region: Secondary | ICD-10-CM

## 2022-06-28 DIAGNOSIS — R292 Abnormal reflex: Secondary | ICD-10-CM

## 2022-06-28 DIAGNOSIS — R202 Paresthesia of skin: Secondary | ICD-10-CM

## 2022-06-28 NOTE — Procedures (Signed)
[facility]  [address]  [place], [place] [address] Tel: [phone] Fax: [phone] Test Date:  06/28/2022  Patient: Gloria Torres DOB: June 10, 1971 Physician: Kai Levins, MD  Sex: Female Height: 5' 2.5" Ref Phys: Metta Clines, DO  ID#: [id number]   Technician:    History: This is a 51 year old female with paresthesias.  NCV & EMG Findings: Extensive electrodiagnostic evaluation of the left upper and lower limbs with additional nerve conduction studies in the right lower limb shows: Left sural, superficial peroneal, median, and ulnar sensory responses are within normal limits. Left peroneal (EDB) motor responses shows reduced amplitude (1.2 mV). Left peroneal (TA), left tibial (AH), left median (APB), left ulnar (ADM), and right peroneal (EDB) motor responses are within normal limits. Left H reflex is within normal limits. Chronic motor axon loss changes without accompanying active denervation changes are seen in the left tibialis anterior, flexor digitorum longus, and rectus femoris muscles. All other tested muscles are within normal limits with normal motor unit configuration and recruit patterns.  Impression: This is an abnormal electrodiagnostic study. The findings are most consistent with the following: The residuals of an old intraspinal canal lesion (ie: motor radiculopathy) at the left L4-L5 nerve roots, mild to moderate in degree electrically. No electrodiagnostic evidence of a generalized large fiber polyneuropathy. No electrodiagnostic evidence of a left cervical (C5-C8) motor radiculopathy. No electrodiagnostic evidence of a left median mononeuropathy at or distal to the wrist, consistent with carpal tunnel syndrome. Screening studies for a left ulnar mononeuropathy are normal.   ___________________________ Kai Levins, MD    Nerve Conduction Studies   Stim Site NR Peak (ms) Norm Peak (ms) O-P Amp (V) Norm O-P Amp  Left Median Anti Sensory  (2nd Digit)  32.1 C  Wrist    2.9 <3.6 76.5 >15  Left Sup Peroneal Anti Sensory (Ant Lat Mall)  34.6 C  12 cm    2.4 <4.6 7.8 >4  Left Sural Anti Sensory (Lat Mall)  34.6 C  Calf    3.8 <4.6 12.4 >4  Left Ulnar Anti Sensory (5th Digit)  32.1 C  Wrist    3.1 <3.1 22.7 >10     Stim Site NR Onset (ms) Norm Onset (ms) O-P Amp (mV) Norm O-P Amp Site1 Site2 Delta-0 (ms) Dist (cm) Vel (m/s) Norm Vel (m/s)  Left Median Motor (Abd Poll Brev)  32.1 C  Wrist    2.3 <4.0 9.5 >6 Elbow Wrist 4.0 23.5 59 >50  Elbow    6.3  8.8         Left Peroneal Motor (Ext Dig Brev)  34.6 C  Ankle    4.6 <6.0 *1.2 >2.5 B Fib Ankle 6.9 0.0  >40  B Fib    11.5  1.1  Poplt B Fib 1.0 0.0  >40  Poplt    12.5  1.1         Right Peroneal Motor (Ext Dig Brev)  32.1 C  Ankle    4.4 <6.0 3.5 >2.5 B Fib Ankle 5.2 29.0 56 >40  B Fib    9.6  3.4  Poplt B Fib 1.7 8.0 47 >40  Poplt    11.3  3.3         Left Peroneal TA Motor (Tib Ant)  34.6 C  Fib Head    1.6 <4.5 5.9 >3 Poplit Fib Head 1.3 10.0 77 >40  Poplit    2.9 <5.7 5.8  Left Tibial Motor (Abd Hall Brev)  34.6 C  Ankle    3.2 <6.0 20.4 >4 Knee Ankle 6.6 37.0 56 >40  Knee    9.8  14.8         Left Ulnar Motor (Abd Dig Minimi)  32.1 C  Wrist    2.2 <3.1 11.6 >7 B Elbow Wrist 2.8 19.0 68 >50  B Elbow    5.0  11.2  A Elbow B Elbow 1.8 10.0 56 >50  A Elbow    6.8  11.1          Electromyography   Side Muscle Ins.Act Fibs Fasc Recrt Amp Dur Poly Activation Comment  Left AntTibialis Nml Nml Nml *1- *1+ *1+ *1+ Nml N/A  Left Gastroc Nml Nml Nml Nml Nml Nml Nml Nml N/A  Left Flex Dig Long Nml Nml Nml *2- *1+ *1+ *1+ Nml N/A  Left BicepsFemS Nml Nml Nml Nml Nml Nml Nml Nml N/A  Left RectFemoris Nml Nml Nml *2- *1+ *1+ *1+ Nml N/A  Left GluteusMed Nml Nml Nml Nml Nml Nml Nml Nml N/A  Left 1stDorInt Nml Nml Nml Nml Nml Nml Nml Nml N/A  Left Abd Poll Brev Nml Nml Nml Nml Nml Nml Nml Nml N/A  Left PronatorTeres Nml Nml Nml Nml Nml Nml Nml Nml N/A  Left  Biceps Nml Nml Nml Nml Nml Nml Nml Nml N/A  Left Triceps Nml Nml Nml Nml Nml Nml Nml Nml N/A  Left Deltoid Nml Nml Nml Nml Nml Nml Nml Nml N/A  Left AdductorLong Nml Nml Nml Nml Nml Nml Nml Nml N/A  Left L5 Parasp Nml Nml Nml Nml Nml Nml Nml Nml N/A  Left Ext Indicis Nml Nml Nml Nml Nml Nml Nml Nml N/A      Waveforms:

## 2022-09-09 DIAGNOSIS — Z13820 Encounter for screening for osteoporosis: Secondary | ICD-10-CM | POA: Diagnosis not present

## 2022-09-09 DIAGNOSIS — N939 Abnormal uterine and vaginal bleeding, unspecified: Secondary | ICD-10-CM | POA: Diagnosis not present

## 2022-09-21 DIAGNOSIS — M25511 Pain in right shoulder: Secondary | ICD-10-CM | POA: Diagnosis not present

## 2022-09-22 DIAGNOSIS — N939 Abnormal uterine and vaginal bleeding, unspecified: Secondary | ICD-10-CM | POA: Diagnosis not present

## 2022-09-22 DIAGNOSIS — N926 Irregular menstruation, unspecified: Secondary | ICD-10-CM | POA: Diagnosis not present

## 2022-10-26 DIAGNOSIS — M7541 Impingement syndrome of right shoulder: Secondary | ICD-10-CM | POA: Diagnosis not present

## 2022-12-01 ENCOUNTER — Encounter: Payer: 59 | Admitting: Family Medicine

## 2022-12-15 ENCOUNTER — Ambulatory Visit (INDEPENDENT_AMBULATORY_CARE_PROVIDER_SITE_OTHER): Payer: 59 | Admitting: Family Medicine

## 2022-12-15 ENCOUNTER — Encounter: Payer: Self-pay | Admitting: Family Medicine

## 2022-12-15 VITALS — BP 110/80 | HR 61 | Temp 97.6°F | Ht 63.5 in | Wt 175.8 lb

## 2022-12-15 DIAGNOSIS — Z0000 Encounter for general adult medical examination without abnormal findings: Secondary | ICD-10-CM | POA: Diagnosis not present

## 2022-12-15 DIAGNOSIS — Z13220 Encounter for screening for lipoid disorders: Secondary | ICD-10-CM

## 2022-12-15 DIAGNOSIS — Z6830 Body mass index (BMI) 30.0-30.9, adult: Secondary | ICD-10-CM

## 2022-12-15 DIAGNOSIS — Z1231 Encounter for screening mammogram for malignant neoplasm of breast: Secondary | ICD-10-CM | POA: Diagnosis not present

## 2022-12-15 LAB — COMPREHENSIVE METABOLIC PANEL
ALT: 30 U/L (ref 0–35)
AST: 25 U/L (ref 0–37)
Albumin: 4.1 g/dL (ref 3.5–5.2)
Alkaline Phosphatase: 71 U/L (ref 39–117)
BUN: 11 mg/dL (ref 6–23)
CO2: 25 mEq/L (ref 19–32)
Calcium: 9.2 mg/dL (ref 8.4–10.5)
Chloride: 102 mEq/L (ref 96–112)
Creatinine, Ser: 0.67 mg/dL (ref 0.40–1.20)
GFR: 101.75 mL/min (ref >60.00)
Glucose, Bld: 80 mg/dL (ref 70–99)
Potassium: 3.6 mEq/L (ref 3.5–5.1)
Sodium: 137 mEq/L (ref 135–145)
Total Bilirubin: 0.5 mg/dL (ref 0.2–1.2)
Total Protein: 7.2 g/dL (ref 6.0–8.3)

## 2022-12-15 LAB — LIPID PANEL
Cholesterol: 194 mg/dL (ref 0–200)
HDL: 44.8 mg/dL (ref >39.00)
LDL Cholesterol: 123 mg/dL — ABNORMAL HIGH (ref 0–99)
NonHDL: 148.88
Total CHOL/HDL Ratio: 4
Triglycerides: 131 mg/dL (ref 0.0–149.0)
VLDL: 26.2 mg/dL (ref 0.0–40.0)

## 2022-12-15 LAB — TSH: TSH: 1.29 u[IU]/mL (ref 0.35–5.50)

## 2022-12-15 NOTE — Patient Instructions (Signed)

## 2022-12-15 NOTE — Progress Notes (Signed)
Complete physical exam  Patient: Gloria Torres   DOB: [birth year]   51 y.o. Female  MRN: [id number]  Subjective:    Chief Complaint  Patient presents with   Annual Exam    Gloria Torres is a 52 y.o. female who presents today for a complete physical exam. She reports consuming a general diet. Exercise is limited by orthopedic condition(s): chronic back pain, shoulder pain. She generally feels well. She reports sleeping well. She does not have additional problems to discuss today.    Most recent fall risk assessment:    02/22/2022    7:45 AM  Fall Risk   Falls in the past year? 0  Number falls in past yr: 0  Injury with Fall? 0     Most recent depression screenings:    12/15/2022   10:11 AM 05/17/2022    8:48 AM  PHQ 2/9 Scores  PHQ - 2 Score 0 0  PHQ- 9 Score  2    Dental: No current dental problems and Receives regular dental care  Patient Active Problem List   Diagnosis Date Noted   BMI 30.0-30.9,adult 12/15/2022   GERD without esophagitis 05/17/2022   Paresthesia of skin 05/17/2022   Blurry vision, bilateral 05/17/2022   Lumbar back pain with radiculopathy affecting right lower extremity 07/17/2018      Patient Care Team: Karie Georges, MD as PCP - General (Family Medicine)   Outpatient Medications Prior to Visit  Medication Sig   b complex vitamins capsule Take 1 capsule by mouth daily.   Famotidine (PEPCID PO) Take 20 mg by mouth. 2 times daily   VITAMIN D PO Take by mouth daily.   No facility-administered medications prior to visit.    Review of Systems  HENT:  Negative for hearing loss.   Eyes:  Negative for blurred vision.  Respiratory:  Negative for shortness of breath.   Cardiovascular:  Negative for chest pain.  Gastrointestinal: Negative.   Genitourinary: Negative.   Musculoskeletal:  Negative for back pain.  Neurological:  Negative for headaches.  Psychiatric/Behavioral:  Negative for depression.   All other systems reviewed and are  negative.      Objective:     BP 110/80 (BP Location: Left Arm, Patient Position: Sitting, Cuff Size: Normal)   Pulse 61   Temp 97.6 F (36.4 C) (Oral)   Ht 5' 3.5" (1.613 m)   Wt 175 lb 12.8 oz (79.7 kg)   LMP 12/08/2022 (Approximate)   SpO2 99%   BMI 30.65 kg/m    Physical Exam Vitals reviewed.  Constitutional:      Appearance: Normal appearance. She is well-groomed and normal weight.  HENT:     Right Ear: Tympanic membrane normal.     Left Ear: Tympanic membrane normal.     Nose: Nose normal.     Mouth/Throat:     Mouth: Mucous membranes are moist.  Eyes:     Conjunctiva/sclera: Conjunctivae normal.  Neck:     Thyroid: No thyromegaly.  Cardiovascular:     Rate and Rhythm: Normal rate and regular rhythm.     Pulses: Normal pulses.     Heart sounds: S1 normal and S2 normal.  Pulmonary:     Effort: Pulmonary effort is normal.     Breath sounds: Normal breath sounds and air entry.  Abdominal:     General: Bowel sounds are normal.  Musculoskeletal:     Right lower leg: No edema.     Left lower leg:  No edema.  Lymphadenopathy:     Cervical: No cervical adenopathy.  Neurological:     Mental Status: She is alert and oriented to person, place, and time. Mental status is at baseline.     Gait: Gait is intact.  Psychiatric:        Mood and Affect: Mood and affect normal.        Speech: Speech normal.        Behavior: Behavior normal.        Judgment: Judgment normal.      No results found for any visits on 12/15/22.     Assessment & Plan:    Routine Health Maintenance and Physical Exam  Immunization History  Administered Date(s) Administered   Influenza-Unspecified 05/05/2018, 04/16/2021   PFIZER(Purple Top)SARS-COV-2 Vaccination 08/06/2019, 08/24/2019, 06/06/2020   Tdap 12/15/2015    Health Maintenance  Topic Date Due   Zoster Vaccines- Shingrix (1 of 2) 03/17/2023 (Originally 04/19/2022)   COVID-19 Vaccine (4 - 2023-24 season) 12/15/2023 (Originally  04/16/2022)   Hepatitis C Screening  12/15/2023 (Originally 04/19/1990)   HIV Screening  12/15/2023 (Originally 04/20/1987)   INFLUENZA VACCINE  03/17/2023   PAP SMEAR-Modifier  07/15/2023   MAMMOGRAM  10/01/2023   DTaP/Tdap/Td (2 - Td or Tdap) 12/14/2025   COLONOSCOPY (Pts 45-34yrs Insurance coverage will need to be confirmed)  12/14/2028   HPV VACCINES  Aged Out    Discussed health benefits of physical activity, and encouraged her to engage in regular exercise appropriate for her age and condition.  Preventative health care -     Comprehensive metabolic panel Normal physical exam findings today. We discussed healthy diet and exercise patterns. HM reviewed, mammogram ordered for patient. Labs also ordered.  Lipid screening -     Lipid panel  Breast cancer screening by mammogram -     Digital Screening Mammogram, Left and Right; Future  BMI 30.0-30.9,adult -     TSH    Return in about 1 year (around 12/15/2023) for annual physical exam.     Karie Georges, MD

## 2023-05-26 NOTE — H&P (Signed)
PREOPERATIVE H&P  Chief Complaint: right shoulder cartilage disorder, impingement syndrome, OA, biceps tendinitis  HPI: Gloria Torres is a 52 y.o. female who is scheduled for, Procedure(s): SHOULDER ARTHROSCOPY WITH SUBACROMIAL DECOMPRESSION AND BICEP TENDON REPAIR SHOULDER ARTHROSCOPY WITH DISTAL CLAVICLE EXCISION.   Patient has a past medical history significant for GERD.   Patient has been followed for right shoulder versus neck pain.  She had significant relief from her injection, but she did still have some pain down in her elbow.  She also has some parascapular pain that she does not think really responded.  Her injection seems to be wearing off.  She has been traveling and has some trips coming in the summer.  She wants to make sure she is ready for those.  She is still working as a Social research officer, government at the [employer]    Symptoms are rated as moderate to severe, and have been worsening.  This is significantly impairing activities of daily living.    Please see clinic note for further details on this patient's care.    She has elected for surgical management.   Past Medical History:  Diagnosis Date   GERD (gastroesophageal reflux disease)    Past Surgical History:  Procedure Laterality Date   CESAREAN SECTION     TONSILLECTOMY  1992   Social History   Socioeconomic History   Marital status: Significant Other    Spouse name: Not on file   Number of children: 1   Years of education: Not on file   Highest education level: Not on file  Occupational History   Occupation: CT Theme park manager: [place]  Tobacco Use   Smoking status: Never   Smokeless tobacco: Never  Vaping Use   Vaping status: Never Used  Substance and Sexual Activity   Alcohol use: Yes    Comment: social   Drug use: No   Sexual activity: Yes  Other Topics Concern   Not on file  Social History Narrative   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   Right handed   Caffeine 2 sodas a day   Lives  with daughter and boyfriend in a two story home   Social Determinants of Health   Financial Resource Strain: Not on file  Food Insecurity: Not on file  Transportation Needs: Not on file  Physical Activity: Not on file  Stress: Not on file  Social Connections: Not on file   Family History  Problem Relation Age of Onset   Memory loss Mother    Diabetes Father    Prostate cancer Father    Asthma Sister    Alzheimer's disease Brother 67   Colon cancer Maternal Grandmother 70   Lung cancer Maternal Grandfather    Hodgkin's lymphoma Paternal Grandmother    Heart failure Paternal Grandfather    Breast cancer Neg Hx    Colon polyps Neg Hx    Esophageal cancer Neg Hx    Stomach cancer Neg Hx    Rectal cancer Neg Hx    Allergies  Allergen Reactions   Sulfa Antibiotics Nausea And Vomiting   Erythromycin Rash   Prior to Admission medications   Medication Sig Start Date End Date Taking? Authorizing Provider  b complex vitamins capsule Take 1 capsule by mouth daily.    [provider]  Famotidine (PEPCID PO) Take 20 mg by mouth. 2 times daily    [provider]  VITAMIN D PO Take by mouth daily.  [provider]    ROS: All other systems have been reviewed and were otherwise negative with the exception of those mentioned in the HPI and as above.  Physical Exam: General: Alert, no acute distress Cardiovascular: No pedal edema Respiratory: No cyanosis, no use of accessory musculature GI: No organomegaly, abdomen is soft and non-tender Skin: No lesions in the area of chief complaint Neurologic: Sensation intact distally Psychiatric: Patient is competent for consent with normal mood and affect Lymphatic: No axillary or cervical lymphadenopathy  MUSCULOSKELETAL:  Range of motion of the shoulder is full.  She has external rotation to 45 degrees.  Cuff strength is intact.  Positive O'Brien's.  Positive bicipital groove tenderness to palpation.  Positive  impingement.  Positive AC tenderness to palpation.  There is no obvious tenderness to palpation at the elbow.  Elbow range of motion is full.  Imaging: MRI from previous which demonstrates some fluid around the biceps and the anterior cuff with some fraying of the superficial bursal side of the cuff.  There are degenerative changes of the [facility] joint and fluid around the [facility] joint in the bone.  Assessment: right shoulder cartilage disorder, impingement syndrome, OA, biceps tendinitis  Plan: Plan for Procedure(s): SHOULDER ARTHROSCOPY WITH SUBACROMIAL DECOMPRESSION AND BICEP TENDON REPAIR SHOULDER ARTHROSCOPY WITH DISTAL CLAVICLE EXCISION  The risks benefits and alternatives were discussed with the patient including but not limited to the risks of nonoperative treatment, versus surgical intervention including infection, bleeding, nerve injury,  blood clots, cardiopulmonary complications, morbidity, mortality, among others, and they were willing to proceed.   The patient acknowledged the explanation, agreed to proceed with the plan and consent was signed.   Operative Plan: Right shoulder scope with SAD, DCE, BT, possible RCR Discharge Medications: Tylenol, Celebrex, Oxycodone, Zofran DVT Prophylaxis: none Physical Therapy: outpatient PT Special Discharge needs: Sling. IceMan   Vernetta Honey, PA-C  05/26/2023 3:10 PM

## 2023-06-02 ENCOUNTER — Encounter (HOSPITAL_BASED_OUTPATIENT_CLINIC_OR_DEPARTMENT_OTHER): Payer: Self-pay | Admitting: Orthopaedic Surgery

## 2023-06-06 NOTE — Discharge Instructions (Signed)
Ramond Marrow MD, MPH Alfonse Alpers, PA-C [facility] [address] [phone] (tel)   [phone] (fax)   POST-OPERATIVE INSTRUCTIONS - SHOULDER ARTHROSCOPY  WOUND CARE You may remove the Operative Dressing on Post-Op Day #3 (72hrs after surgery).   Alternatively if you would like you can leave dressing on until follow-up if within 7-8 days but keep it dry. Leave steri-strips in place until they fall off on their own, usually 2 weeks postop. There may be a small amount of fluid/bleeding leaking at the surgical site.  This is normal; the shoulder is filled with fluid during the procedure and can leak for 24-48hrs after surgery.  You may change/reinforce the bandage as needed.  Use the Cryocuff or Ice as often as possible for the first 7 days, then as needed for pain relief. Always keep a towel, ACE wrap or other barrier between the cooling unit and your skin.  You may shower on Post-Op Day #3. Gently pat the area dry.  Do not soak the shoulder in water or submerge it.  Keep incisions as dry as possible. Do not go swimming in the pool or ocean until 4 weeks after surgery or when otherwise instructed.    EXERCISES Wear the sling at all times  You may remove the sling for showering, but keep the arm across the chest or in a secondary sling.     It is normal for your fingers/hand to become more swollen after surgery and discolored from bruising.   This will resolve over the first few weeks usually after surgery. Please continue to ambulate and do not stay sitting or lying for too long.  Perform foot and wrist pumps to assist in circulation.  PHYSICAL THERAPY - You will begin physical therapy soon after surgery (unless otherwise specified) - Please call to set up an appointment, if you do not already have one  - Let our office if there are any issues with scheduling your therapy  - We will schedule a PT apt for you at our office or send out a referral  to your place choice. Please call to let us know what you prefer.    REGIONAL ANESTHESIA (NERVE BLOCKS) The anesthesia team may have performed a nerve block for you this is a great tool used to minimize pain.   The block may start wearing off overnight (between 8-24 hours postop) When the block wears off, your pain may go from nearly zero to the pain you would have had postop without the block. This is an abrupt transition but nothing dangerous is happening.   This can be a challenging period but utilize your as needed pain medications to try and manage this period. We suggest you use the pain medication the first night prior to going to bed, to ease this transition.  You may take an extra dose of narcotic when this happens if needed  POST-OP MEDICATIONS- Multimodal approach to pain control In general your pain will be controlled with a combination of substances.  Prescriptions unless otherwise discussed are electronically sent to your pharmacy.  This is a carefully made plan we use to minimize narcotic use.     Meloxicam - Anti-inflammatory medication taken on a scheduled basis Acetaminophen - Non-narcotic pain medicine taken on a scheduled basis  Oxycodone - This is a strong narcotic, to be used only on an "as needed" basis for SEVERE pain. Zofran - take as needed for nausea   FOLLOW-UP If you develop a  Fever (>=101.5), Redness or Drainage from the surgical incision site, please call our office to arrange for an evaluation. Please call the office to schedule a follow-up appointment for your first post-operative appointment, 7-10 days post-operatively.    HELPFUL INFORMATION   You may be more comfortable sleeping in a semi-seated position the first few nights following surgery.  Keep a pillow propped under the elbow and forearm for comfort.  If you have a recliner type of chair it might be beneficial.  If not that is fine too, but it would be helpful to sleep propped up with pillows  behind your operated shoulder as well under your elbow and forearm.  This will reduce pulling on the suture lines.  When dressing, put your operative arm in the sleeve first.  When getting undressed, take your operative arm out last.  Loose fitting, button-down shirts are recommended.  Often in the first days after surgery you may be more comfortable keeping your operative arm under your shirt and not through the sleeve.  You may return to work/school in the next couple of days when you feel up to it.  Desk work and typing in the sling is fine.  We suggest you use the pain medication the first night prior to going to bed, in order to ease any pain when the anesthesia wears off. You should avoid taking pain medications on an empty stomach as it will make you nauseous.  You should wean off your narcotic medicines as soon as you are able.  Most patients will be off narcotics before their first postop appointment.   Do not drink alcoholic beverages or take illicit drugs when taking pain medications.  It is against the law to drive while taking narcotics.  In some states it is against the law to drive while your arm is in a sling.   Pain medication may make you constipated.  Below are a few solutions to try in this order: Decrease the amount of pain medication if you aren't having pain. Drink lots of decaffeinated fluids. Drink prune juice and/or eat dried prunes  If the first 3 don't work start with additional solutions Take Colace - an over-the-counter stool softener Take Senokot - an over-the-counter laxative Take Miralax - a stronger over-the-counter laxative  For more information including helpful videos and documents visit our website:   https://[email or web address].com/patient-information.[email or web address]     UltraSling Information:  Detach shoulder strap buckles and open front panel.        Position elbow in the sling as far back as possible 2.   Place shoulder strap over opposite shoulder        Connect shoulder strap to the sling using the quick release buckles. 3.   Secure strap at the top. It should go over your forearm and attach to the other side.        The thumb strap attaches to the front of the sling. It goes between your thumb and index finger. 5.   The pillow should be at your waistline.        Your sling will Velcro to the pillow.        Buckle the waist strap to the pillow and adjust the strap as needed.   Here is a video you can watch:  [email or web address]  Post Anesthesia Home Care Instructions  Activity: Get plenty of rest for the remainder of the day. A responsible individual must stay with you for 24 hours following the procedure.  For the next 24  hours, DO NOT: -Drive a car -Advertising copywriter -Drink alcoholic beverages -Take any medication unless instructed by your physician -Make any legal decisions or sign important papers.  Meals: Start with liquid foods such as gelatin or soup. Progress to regular foods as tolerated. Avoid greasy, spicy, heavy foods. If nausea and/or vomiting occur, drink only clear liquids until the nausea and/or vomiting subsides. Call your physician if vomiting continues.  Special Instructions/Symptoms: Your throat may feel dry or sore from the anesthesia or the breathing tube placed in your throat during surgery. If this causes discomfort, gargle with warm salt water. The discomfort should disappear within 24 hours.  If you had a scopolamine patch placed behind your ear for the management of post- operative nausea and/or vomiting:  1. The medication in the patch is effective for 72 hours, after which it should be removed.  Wrap patch in a tissue and discard in the trash. Wash hands thoroughly with soap and water. 2. You may remove the patch earlier than 72 hours if you experience unpleasant side effects which may include dry mouth, dizziness or visual disturbances. 3. Avoid touching the patch. Wash your hands  with soap and water after contact with the patch.    May have Tylenol at 2:30pm if needed

## 2023-06-07 NOTE — Progress Notes (Signed)
Surgical soap given with instructions, pt verbalized understanding. Benzoyl peroxide gel given with instructions, pt verbalized understanding.  

## 2023-06-09 ENCOUNTER — Encounter (HOSPITAL_BASED_OUTPATIENT_CLINIC_OR_DEPARTMENT_OTHER)
Admission: RE | Disposition: A | Discharge status: Home / Self Care | Payer: Self-pay | Source: Ambulatory Visit | Attending: Orthopaedic Surgery

## 2023-06-09 ENCOUNTER — Ambulatory Visit (HOSPITAL_BASED_OUTPATIENT_CLINIC_OR_DEPARTMENT_OTHER): Payer: Self-pay | Admitting: Anesthesiology

## 2023-06-09 ENCOUNTER — Other Ambulatory Visit: Payer: Self-pay

## 2023-06-09 ENCOUNTER — Ambulatory Visit (HOSPITAL_BASED_OUTPATIENT_CLINIC_OR_DEPARTMENT_OTHER)
Admission: RE | Admit: 2023-06-09 | Discharge: 2023-06-09 | Disposition: A | Discharge status: Home / Self Care | Payer: 59 | Source: Ambulatory Visit | Attending: Orthopaedic Surgery | Admitting: Orthopaedic Surgery

## 2023-06-09 ENCOUNTER — Ambulatory Visit (HOSPITAL_BASED_OUTPATIENT_CLINIC_OR_DEPARTMENT_OTHER): Payer: 59 | Admitting: Anesthesiology

## 2023-06-09 DIAGNOSIS — M7541 Impingement syndrome of right shoulder: Secondary | ICD-10-CM | POA: Insufficient documentation

## 2023-06-09 DIAGNOSIS — S46011A Strain of muscle(s) and tendon(s) of the rotator cuff of right shoulder, initial encounter: Secondary | ICD-10-CM | POA: Diagnosis not present

## 2023-06-09 DIAGNOSIS — Z01818 Encounter for other preprocedural examination: Secondary | ICD-10-CM

## 2023-06-09 DIAGNOSIS — G8918 Other acute postprocedural pain: Secondary | ICD-10-CM | POA: Diagnosis not present

## 2023-06-09 DIAGNOSIS — M19011 Primary osteoarthritis, right shoulder: Secondary | ICD-10-CM | POA: Insufficient documentation

## 2023-06-09 DIAGNOSIS — M7521 Bicipital tendinitis, right shoulder: Secondary | ICD-10-CM | POA: Insufficient documentation

## 2023-06-09 DIAGNOSIS — M75121 Complete rotator cuff tear or rupture of right shoulder, not specified as traumatic: Secondary | ICD-10-CM | POA: Insufficient documentation

## 2023-06-09 DIAGNOSIS — K219 Gastro-esophageal reflux disease without esophagitis: Secondary | ICD-10-CM | POA: Insufficient documentation

## 2023-06-09 DIAGNOSIS — M24111 Other articular cartilage disorders, right shoulder: Secondary | ICD-10-CM | POA: Diagnosis not present

## 2023-06-09 HISTORY — DX: Other specified postprocedural states: R11.2

## 2023-06-09 HISTORY — PX: SHOULDER ARTHROSCOPY WITH DISTAL CLAVICLE RESECTION: SHX5675

## 2023-06-09 HISTORY — PX: SHOULDER ARTHROSCOPY WITH SUBACROMIAL DECOMPRESSION AND BICEP TENDON REPAIR: SHX5689

## 2023-06-09 LAB — POCT PREGNANCY, URINE
Preg Test, Ur: NEGATIVE
Preg Test, Ur: NEGATIVE

## 2023-06-09 SURGERY — SHOULDER ARTHROSCOPY WITH SUBACROMIAL DECOMPRESSION AND BICEP TENDON REPAIR
Anesthesia: Regional | Site: Shoulder | Laterality: Right

## 2023-06-09 MED ORDER — OXYCODONE HCL 5 MG PO TABS: 5.0000 mg | ORAL_TABLET | Freq: Once | ORAL | Status: DC | PRN

## 2023-06-09 MED ORDER — LACTATED RINGERS IV SOLN: INTRAVENOUS | Status: DC

## 2023-06-09 MED ORDER — LIDOCAINE HCL (CARDIAC) PF 100 MG/5ML IV SOSY
PREFILLED_SYRINGE | INTRAVENOUS | Status: DC | PRN
  Administered 2023-06-09: 60 mg via INTRAVENOUS

## 2023-06-09 MED ORDER — GLYCOPYRROLATE 0.2 MG/ML IJ SOLN
INTRAMUSCULAR | Status: DC | PRN
  Administered 2023-06-09: .2 mg via INTRAVENOUS

## 2023-06-09 MED ORDER — MIDAZOLAM HCL 2 MG/2ML IJ SOLN
INTRAMUSCULAR | Status: AC
Start: 2023-06-09 — End: ?
  Filled 2023-06-09: qty 2

## 2023-06-09 MED ORDER — ONDANSETRON HCL 4 MG/2ML IJ SOLN
INTRAMUSCULAR | Status: DC | PRN
  Administered 2023-06-09: 4 mg via INTRAVENOUS

## 2023-06-09 MED ORDER — ROCURONIUM BROMIDE 100 MG/10ML IV SOLN
INTRAVENOUS | Status: DC | PRN
  Administered 2023-06-09: 70 mg via INTRAVENOUS

## 2023-06-09 MED ORDER — PROPOFOL 10 MG/ML IV BOLUS
INTRAVENOUS | Status: DC | PRN
  Administered 2023-06-09: 200 mg via INTRAVENOUS

## 2023-06-09 MED ORDER — GLYCOPYRROLATE PF 0.2 MG/ML IJ SOSY
PREFILLED_SYRINGE | INTRAMUSCULAR | Status: AC
  Filled 2023-06-09: qty 1

## 2023-06-09 MED ORDER — ONDANSETRON HCL 4 MG/2ML IJ SOLN
INTRAMUSCULAR | Status: AC
Start: 2023-06-09 — End: ?
  Filled 2023-06-09: qty 2

## 2023-06-09 MED ORDER — BUPIVACAINE LIPOSOME 1.3 % IJ SUSP
INTRAMUSCULAR | Status: DC | PRN
  Administered 2023-06-09: 10 mL via PERINEURAL

## 2023-06-09 MED ORDER — DEXAMETHASONE SODIUM PHOSPHATE 10 MG/ML IJ SOLN
INTRAMUSCULAR | Status: AC
  Filled 2023-06-09: qty 1

## 2023-06-09 MED ORDER — PROPOFOL 10 MG/ML IV BOLUS
INTRAVENOUS | Status: AC
  Filled 2023-06-09: qty 20

## 2023-06-09 MED ORDER — FENTANYL CITRATE (PF) 100 MCG/2ML IJ SOLN: 25.0000 ug | INTRAMUSCULAR | Status: DC | PRN

## 2023-06-09 MED ORDER — CEFAZOLIN SODIUM-DEXTROSE 2-4 GM/100ML-% IV SOLN
INTRAVENOUS | Status: AC
  Filled 2023-06-09: qty 100

## 2023-06-09 MED ORDER — TRANEXAMIC ACID-NACL 1000-0.7 MG/100ML-% IV SOLN
1000.0000 mg | INTRAVENOUS | Status: AC
  Administered 2023-06-09: 1000 mg via INTRAVENOUS

## 2023-06-09 MED ORDER — OXYCODONE HCL 5 MG/5ML PO SOLN: 5.0000 mg | Freq: Once | ORAL | Status: DC | PRN

## 2023-06-09 MED ORDER — ONDANSETRON HCL 4 MG PO TABS: 4.0000 mg | ORAL_TABLET | Freq: Three times a day (TID) | ORAL | 0 refills | Status: AC | PRN

## 2023-06-09 MED ORDER — SCOPOLAMINE 1 MG/3DAYS TD PT72
MEDICATED_PATCH | TRANSDERMAL | Status: AC
  Filled 2023-06-09: qty 1

## 2023-06-09 MED ORDER — SODIUM CHLORIDE (PF) 0.9 % IJ SOLN
INTRAMUSCULAR | Status: DC | PRN
  Administered 2023-06-09: 3000 mL

## 2023-06-09 MED ORDER — TRANEXAMIC ACID-NACL 1000-0.7 MG/100ML-% IV SOLN
INTRAVENOUS | Status: AC
Start: 2023-06-09 — End: ?
  Filled 2023-06-09: qty 100

## 2023-06-09 MED ORDER — SUGAMMADEX SODIUM 200 MG/2ML IV SOLN
INTRAVENOUS | Status: DC | PRN
  Administered 2023-06-09: 350 mg via INTRAVENOUS

## 2023-06-09 MED ORDER — MIDAZOLAM HCL 2 MG/2ML IJ SOLN
2.0000 mg | Freq: Once | INTRAMUSCULAR | Status: AC
  Administered 2023-06-09: 2 mg via INTRAVENOUS

## 2023-06-09 MED ORDER — PHENYLEPHRINE HCL-NACL 20-0.9 MG/250ML-% IV SOLN
INTRAVENOUS | Status: DC | PRN
  Administered 2023-06-09: 50 ug/min via INTRAVENOUS

## 2023-06-09 MED ORDER — EPHEDRINE SULFATE (PRESSORS) 50 MG/ML IJ SOLN
INTRAMUSCULAR | Status: DC | PRN
  Administered 2023-06-09: 5 mg via INTRAVENOUS

## 2023-06-09 MED ORDER — BUPIVACAINE HCL (PF) 0.5 % IJ SOLN
INTRAMUSCULAR | Status: DC | PRN
  Administered 2023-06-09: 10 mL via PERINEURAL

## 2023-06-09 MED ORDER — ACETAMINOPHEN 500 MG PO TABS
1000.0000 mg | ORAL_TABLET | Freq: Once | ORAL | Status: AC
  Administered 2023-06-09: 1000 mg via ORAL

## 2023-06-09 MED ORDER — LIDOCAINE 2% (20 MG/ML) 5 ML SYRINGE
INTRAMUSCULAR | Status: AC
  Filled 2023-06-09: qty 5

## 2023-06-09 MED ORDER — SCOPOLAMINE 1 MG/3DAYS TD PT72: 1.0000 | MEDICATED_PATCH | TRANSDERMAL | Status: DC

## 2023-06-09 MED ORDER — FENTANYL CITRATE (PF) 100 MCG/2ML IJ SOLN
INTRAMUSCULAR | Status: AC
  Filled 2023-06-09: qty 2

## 2023-06-09 MED ORDER — GABAPENTIN 300 MG PO CAPS
300.0000 mg | ORAL_CAPSULE | Freq: Once | ORAL | Status: AC
  Administered 2023-06-09: 300 mg via ORAL

## 2023-06-09 MED ORDER — SODIUM CHLORIDE 0.9 % IR SOLN
Status: DC | PRN
  Administered 2023-06-09 (×2): 3000 mL

## 2023-06-09 MED ORDER — SUGAMMADEX SODIUM 200 MG/2ML IV SOLN: INTRAVENOUS | Status: DC | PRN

## 2023-06-09 MED ORDER — OXYCODONE HCL 5 MG PO TABS
ORAL_TABLET | ORAL | 0 refills | Status: AC
Start: 2023-06-09 — End: 2023-06-11

## 2023-06-09 MED ORDER — ACETAMINOPHEN 500 MG PO TABS
ORAL_TABLET | ORAL | Status: AC
  Filled 2023-06-09: qty 2

## 2023-06-09 MED ORDER — FENTANYL CITRATE (PF) 100 MCG/2ML IJ SOLN
INTRAMUSCULAR | Status: DC | PRN
  Administered 2023-06-09: 100 ug via INTRAVENOUS

## 2023-06-09 MED ORDER — MELOXICAM 15 MG PO TBDP: 15.0000 mg | ORAL_TABLET | Freq: Every day | ORAL | 0 refills | Status: AC

## 2023-06-09 MED ORDER — CEFAZOLIN SODIUM-DEXTROSE 2-4 GM/100ML-% IV SOLN
2.0000 g | INTRAVENOUS | Status: AC
  Administered 2023-06-09: 2 g via INTRAVENOUS

## 2023-06-09 MED ORDER — AMISULPRIDE (ANTIEMETIC) 5 MG/2ML IV SOLN: 10.0000 mg | Freq: Once | INTRAVENOUS | Status: DC | PRN

## 2023-06-09 MED ORDER — GABAPENTIN 300 MG PO CAPS
ORAL_CAPSULE | ORAL | Status: AC
Start: 2023-06-09 — End: ?
  Filled 2023-06-09: qty 1

## 2023-06-09 MED ORDER — DEXAMETHASONE SODIUM PHOSPHATE 10 MG/ML IJ SOLN
INTRAMUSCULAR | Status: DC | PRN
Start: 2023-06-09 — End: 2023-06-09
  Administered 2023-06-09: 10 mg via INTRAVENOUS

## 2023-06-09 MED ORDER — ACETAMINOPHEN 500 MG PO TABS: 1000.0000 mg | ORAL_TABLET | Freq: Three times a day (TID) | ORAL | 0 refills | Status: AC

## 2023-06-09 MED ORDER — ROCURONIUM BROMIDE 10 MG/ML (PF) SYRINGE
PREFILLED_SYRINGE | INTRAVENOUS | Status: AC
Start: 2023-06-09 — End: ?
  Filled 2023-06-09: qty 10

## 2023-06-09 SURGICAL SUPPLY — 55 items
AID PSTN UNV HD RSTRNT DISP (MISCELLANEOUS) ×1
ANCH SUT 2 FBRTK KNTLS 1.8 (Anchor) ×2 IMPLANT
ANCH SUT SWLK 19.1X4.75 (Anchor) ×1 IMPLANT
ANCHOR SUT 1.8 FIBERTAK SB KL (Anchor) IMPLANT
ANCHOR SUT BIO SW 4.75X19.1 (Anchor) IMPLANT
APL PRP STRL LF DISP 70% ISPRP (MISCELLANEOUS) ×1
BLADE EXCALIBUR 4.0X13 (MISCELLANEOUS) ×2 IMPLANT
BURR OVAL 8 FLU 4.0X13 (MISCELLANEOUS) ×2 IMPLANT
CANNULA 5.75X71 LONG (CANNULA) IMPLANT
CANNULA PASSPORT 5 (CANNULA) IMPLANT
CANNULA PASSPORT BUTTON 10-40 (CANNULA) IMPLANT
CANNULA TWIST IN 8.25X7CM (CANNULA) IMPLANT
CHLORAPREP W/TINT 26 (MISCELLANEOUS) ×2 IMPLANT
CLSR STERI-STRIP ANTIMIC 1/2X4 (GAUZE/BANDAGES/DRESSINGS) ×2 IMPLANT
COOLER ICEMAN CLASSIC (MISCELLANEOUS) ×2 IMPLANT
DRAPE IMP U-DRAPE 54X76 (DRAPES) ×2 IMPLANT
DRAPE INCISE IOBAN 66X45 STRL (DRAPES) IMPLANT
DRAPE SHOULDER BEACH CHAIR (DRAPES) ×2 IMPLANT
DW OUTFLOW CASSETTE/TUBE SET (MISCELLANEOUS) ×2 IMPLANT
GAUZE PAD ABD 8X10 STRL (GAUZE/BANDAGES/DRESSINGS) ×2 IMPLANT
GAUZE SPONGE 4X4 12PLY STRL (GAUZE/BANDAGES/DRESSINGS) ×2 IMPLANT
GLOVE BIO SURGEON STRL SZ 6.5 (GLOVE) ×2 IMPLANT
GLOVE BIOGEL PI IND STRL 6.5 (GLOVE) ×2 IMPLANT
GLOVE BIOGEL PI IND STRL 8 (GLOVE) ×2 IMPLANT
GLOVE ECLIPSE 8.0 STRL XLNG CF (GLOVE) ×2 IMPLANT
GOWN STRL REUS W/ TWL LRG LVL3 (GOWN DISPOSABLE) ×4 IMPLANT
GOWN STRL REUS W/TWL LRG LVL3 (GOWN DISPOSABLE) ×2
GOWN STRL REUS W/TWL XL LVL3 (GOWN DISPOSABLE) ×2 IMPLANT
KIT SHOULDER STAB MARCO (KITS) ×2 IMPLANT
KIT STR SPEAR 1.8 FBRTK DISP (KITS) IMPLANT
LASSO CRESCENT QUICKPASS (SUTURE) IMPLANT
MANIFOLD NEPTUNE II (INSTRUMENTS) ×2 IMPLANT
NDL HD SCORPION MEGA LOADER (NEEDLE) IMPLANT
NDL SAFETY ECLIPSE 18X1.5 (NEEDLE) ×2 IMPLANT
PACK ARTHROSCOPY DSU (CUSTOM PROCEDURE TRAY) ×2 IMPLANT
PACK BASIN DAY SURGERY FS (CUSTOM PROCEDURE TRAY) ×2 IMPLANT
PAD COLD SHLDR WRAP-ON (PAD) ×2 IMPLANT
RESTRAINT HEAD UNIVERSAL NS (MISCELLANEOUS) ×2 IMPLANT
SHEET MEDIUM DRAPE 40X70 STRL (DRAPES) ×2 IMPLANT
SLEEVE SCD COMPRESS KNEE MED (STOCKING) ×2 IMPLANT
SLING ARM FOAM STRAP LRG (SOFTGOODS) IMPLANT
SUT FIBERWIRE #2 38 T-5 BLUE (SUTURE)
SUT MNCRL AB 4-0 PS2 18 (SUTURE) ×2 IMPLANT
SUT PDS AB 0 CT 36 (SUTURE) IMPLANT
SUT PDS AB 1 CT 36 (SUTURE) IMPLANT
SUT TIGER TAPE 7 IN WHITE (SUTURE) IMPLANT
SUTURE FIBERWR #2 38 T-5 BLUE (SUTURE) IMPLANT
SUTURE TAPE TIGERLINK 1.3MM BL (SUTURE) IMPLANT
SUTURETAPE TIGERLINK 1.3MM BL (SUTURE)
SYR 5ML LL (SYRINGE) ×2 IMPLANT
TAPE FIBER 2MM 7IN #2 BLUE (SUTURE) IMPLANT
TOWEL GREEN STERILE FF (TOWEL DISPOSABLE) ×4 IMPLANT
TUBE CONNECTING 20X1/4 (TUBING) ×2 IMPLANT
TUBING ARTHROSCOPY IRRIG 16FT (MISCELLANEOUS) ×2 IMPLANT
WAND ABLATOR APOLLO I90 (BUR) ×2 IMPLANT

## 2023-06-09 NOTE — Op Note (Signed)
Orthopaedic Surgery Operative Note (CSN: [id number])  Gloria Torres  [birth year] Date of Surgery: 06/09/2023   DIAGNOSES: Right shoulder, acute on chronic rotator cuff tear, biceps tendinitis, AC arthritis, and subacromial impingement.  POST-OPERATIVE DIAGNOSIS: same  PROCEDURE: Arthroscopic extensive debridement - 29823 Subdeltoid Bursa, Supraspinatus Tendon, Anterior Labrum, Superior Labrum, and glenoid bone, glenoid cartilage, humeral bone and humeral cartilage Arthroscopic distal clavicle excision - 40981 Arthroscopic subacromial decompression - 19147 Arthroscopic rotator cuff repair - 82956 Arthroscopic biceps tenodesis - 21308   OPERATIVE FINDING: Exam under anesthesia: Normal Articular space:  Anterior posterior labral tear Chondral surfaces: Normal Biceps:  Tendinosis of the biceps, no tear Subscapularis: Intact  Supraspinatus: Complete tear.  Single anchor speed fix repair performed, small tear but full-thickness Infraspinatus: Intact    Post-operative plan: The patient will be non-weightbearing in a sling for 6 weeks following her rotator cuff.  The patient will be discharged home.  DVT prophylaxis not indicated in ambulatory upper extremity patient without known risk factors.   Pain control with PRN pain medication preferring oral medicines.  Follow up plan will be scheduled in approximately 7 days for incision check and XR.  Surgeons:Primary: Bjorn Pippin, MD Assistants:Deb Lizbeth Bark Location: [facility] OR ROOM 6 Anesthesia: General with Exparel interscalene block Antibiotics: Ancef 2 g with local vancomycin powder 1 g at the surgical site Tourniquet time: None Estimated Blood Loss: Minimal Complications: None Specimens: None Implants: Implant Name Type Inv. Item Serial No. Manufacturer Lot No. LRB No. Used Action  ANCH SUT 2 FBRTK KNTLS 1.8 - [id number] Anchor ANCH SUT 2 FBRTK KNTLS 1.8  [employer] [id number] Right 1 Implanted  [facility] SUT SWLK 19.1X4.75 - [id number] Anchor  ANCH SUT SWLK 19.1X4.75  [employer] [id number] Right 1 Implanted  ANCH SUT 2 FBRTK KNTLS 1.8 - [id number] Anchor ANCH SUT 2 FBRTK KNTLS 1.8  [employer] [id number] Right 1 Implanted    Indications for Surgery:   Gloria Torres is a 52 y.o. female with continued shoulder pain refractory to nonoperative measures for extended period of time.    The risks and benefits were explained at length including but not limited to continued pain, cuff failure, biceps tenodesis failure, stiffness, need for further surgery and infection.   Procedure:   Patient was correctly identified in the preoperative holding area and operative site marked.  Patient brought to OR and positioned beachchair on an [place] table ensuring that all bony prominences were padded and the head was in an appropriate location.  Anesthesia was induced and the operative shoulder was prepped and draped in the usual sterile fashion.  Timeout was called preincision.  A standard posterior viewing portal was made after localizing the portal with a spinal needle.  An anterior accessory portal was also made.  After clearing the articular space the camera was positioned in the subacromial space.  Findings above.    Extensive debridement was performed of the anterior interval tissue, labral fraying and the bursa.  Glenoid bone, glenoid cartilage, humeral bone were all debrided.  Subacromial decompression: We made a lateral portal with spinal needle guidance. We then proceeded to debride bursal tissue extensively with a shaver and arthrocare device. At that point we continued to identify the borders of the acromion and identify the spur. We then carefully preserved the deltoid fascia and used a burr to convert the acromion to a Type 1 flat acromion without issue.  Biceps tenodesis: We marked the tendon and then performed a tenotomy and debridement of the stump  in the articular space. We then identified the biceps tendon in its groove suprapec with the  arthroscope in the lateral portal taking care to move from lateral to medial to avoid injury to the subscapularis. At that point we unroofed the tendon itself and mobilized it. An accessory anterior portal was made in line with the tendon and we grasped it from the anterior superior portal and worked from the accessory anterior portal. Two Fibertak 1.62mm knotless anchors were placed in the groove and the tendon was secured in a luggage loop style fashion with a pass of the limb of suture through the tendon using a scorpion device to avoid pull-through.  Repair was completed with good tension on the tendon.  Residual stump of the tendon was removed after being resected with a RF ablator.  Distal Clavicle resection:  The scope was placed in the subacromial space from the posterior portal.  A hemostat was placed through the anterior portal and we spread at the [facility] joint.  A burr was then inserted and 10 mm of distal clavicle was resected taking care to avoid damage to the capsule around the joint and avoiding overhanging bone posteriorly.    Rotator cuff repair: We identified a full-thickness leading edge supraspinatus tear that was abutting the biceps.  We performed tenotomy and tenodesis of the biceps.  We decorticated the tuberosity unhealthy tissue we are able to perform a speed fix single row repair.  The posterior superior fashion with a scorpion.  The fiber tape was then brought over to a 4.75 swivel lock 10 mm below the tuberosity.  Good fixation of the tissue was noted.  The incisions were closed with absorbable monocryl and steri strips.  A sterile dressing was placed along with a sling. The patient was awoken from general anesthesia and taken to the PACU in stable condition without complication.   Sander Radon, PA-C, present and scrubbed throughout the case, critical for completion in a timely fashion, and for retraction, instrumentation, closure.

## 2023-06-09 NOTE — Progress Notes (Signed)
Assisted Dr. Neoma Laming with right, interscalene , ultrasound guided block. Side rails up, monitors on throughout procedure. See vital signs in flow sheet. Tolerated Procedure well.

## 2023-06-09 NOTE — Anesthesia Preprocedure Evaluation (Addendum)
Anesthesia Evaluation  Patient identified by MRN, date of birth, ID band Patient awake    Reviewed: Allergy & Precautions, NPO status , Patient's Chart, lab work & pertinent test results  History of Anesthesia Complications (+) PONV and history of anesthetic complications  Airway Mallampati: III  TM Distance: >3 FB Neck ROM: Full  Mouth opening: Limited Mouth Opening  Dental  (+) Dental Advisory Given Crowns on top front teeth:   Pulmonary neg pulmonary ROS   Pulmonary exam normal breath sounds clear to auscultation       Cardiovascular negative cardio ROS  Rhythm:Regular Rate:Normal  TTE 03/27/2019: IMPRESSIONS    1. The left ventricle has normal systolic function, with an ejection  fraction of 55-60%. The cavity size was normal. Left ventricular diastolic  parameters were normal. No evidence of left ventricular regional wall  motion abnormalities.   2. The average left ventricular global longitudinal strain is -20.8 %.   3. The right ventricle has normal systolic function. The cavity was  normal. There is no increase in right ventricular wall thickness. Right  ventricular systolic pressure could not be assessed.   4. The aortic valve was not well visualized.   5. The aorta is normal in size and structure.   6. The interatrial septum appears to be lipomatous.   7. Cannot rule out pericardial effusion as study is poor quality.     Neuro/Psych neg Seizures  Neuromuscular disease (lumbar radiculopathy)    GI/Hepatic Neg liver ROS,GERD  Medicated,,  Endo/Other  negative endocrine ROS    Renal/GU negative Renal ROS     Musculoskeletal   Abdominal  (+) + obese  Peds  Hematology negative hematology ROS (+)   Anesthesia Other Findings   Reproductive/Obstetrics                             Anesthesia Physical Anesthesia Plan  ASA: 2  Anesthesia Plan: General and Regional   Post-op Pain  Management: Regional block* and Tylenol PO (pre-op)*   Induction: Intravenous  PONV Risk Score and Plan: 4 or greater and Ondansetron, Dexamethasone, Midazolam, Scopolamine patch - Pre-op and Treatment may vary due to age or medical condition  Airway Management Planned: Oral ETT  Additional Equipment:   Intra-op Plan:   Post-operative Plan: Extubation in OR  Informed Consent: I have reviewed the patients History and Physical, chart, labs and discussed the procedure including the risks, benefits and alternatives for the proposed anesthesia with the patient or authorized representative who has indicated his/her understanding and acceptance.     Dental advisory given  Plan Discussed with: Anesthesiologist and CRNA  Anesthesia Plan Comments: (Discussed potential risks of nerve blocks including, but not limited to, infection, bleeding, nerve damage, seizures, pneumothorax, respiratory depression, and potential failure of the block. Alternatives to nerve blocks discussed. All questions answered.  Risks of general anesthesia discussed including, but not limited to, sore throat, hoarse voice, chipped/damaged teeth, injury to vocal cords, nausea and vomiting, allergic reactions, lung infection, heart attack, stroke, and death. All questions answered. )        Anesthesia Quick Evaluation

## 2023-06-09 NOTE — Interval H&P Note (Signed)
All questions answered, patient wants to proceed with procedure. ? ?

## 2023-06-09 NOTE — Anesthesia Procedure Notes (Signed)
Anesthesia Regional Block: Interscalene brachial plexus block   Pre-Anesthetic Checklist: , timeout performed,  Correct Patient, Correct Site, Correct Laterality,  Correct Procedure, Correct Position, site marked,  Risks and benefits discussed,  Surgical consent,  Pre-op evaluation,  At surgeon's request and post-op pain management  Laterality: Right  Prep: chloraprep       Needles:  Injection technique: Single-shot  Needle Type: Echogenic Stimulator Needle     Needle Length: 9cm  Needle Gauge: 21     Additional Needles:   Procedures:,,,, ultrasound used (permanent image in chart),,    Narrative:  Start time: 06/09/2023 9:02 AM End time: 06/09/2023 9:05 AM Injection made incrementally with aspirations every 5 mL.  Performed by: Personally  Anesthesiologist: Linton Rump, MD  Additional Notes: Discussed risks and benefits of nerve block including, but not limited to, prolonged and/or permanent nerve injury involving sensory and/or motor function. Monitors were applied and a time-out was performed. The nerve and associated structures were visualized under ultrasound guidance. After negative aspiration, local anesthetic was slowly injected around the nerve. There was no evidence of high pressure during the procedure. There were no paresthesias. VSS remained stable and the patient tolerated the procedure well.

## 2023-06-09 NOTE — Plan of Care (Signed)
[facility] Tonsillectomy/Adenoidectomy, Postoperative PEDS care plan entered in error.

## 2023-06-09 NOTE — Anesthesia Procedure Notes (Signed)
Procedure Name: Intubation Date/Time: 06/09/2023 10:09 AM  Performed by: Thornell Mule, CRNAPre-anesthesia Checklist: Patient identified, Emergency Drugs available, Suction available and Patient being monitored Patient Re-evaluated:Patient Re-evaluated prior to induction Oxygen Delivery Method: Circle system utilized Preoxygenation: Pre-oxygenation with 100% oxygen Induction Type: IV induction Ventilation: Mask ventilation without difficulty Laryngoscope Size: Miller and 3 Grade View: Grade I Tube type: Oral Tube size: 7.0 mm Number of attempts: 1 Airway Equipment and Method: Stylet and Oral airway Placement Confirmation: ETT inserted through vocal cords under direct vision, positive ETCO2 and breath sounds checked- equal and bilateral Secured at: 20 cm Tube secured with: Tape Dental Injury: Teeth and Oropharynx as per pre-operative assessment

## 2023-06-09 NOTE — Transfer of Care (Signed)
Immediate Anesthesia Transfer of Care Note  Patient: IMAAN RIPPER  Procedure(s) Performed: SHOULDER ARTHROSCOPY WITH SUBACROMIAL DECOMPRESSION AND BICEP TENDON REPAIR ROTATOR CUFF REPAIR (Right: Shoulder) SHOULDER ARTHROSCOPY WITH DISTAL CLAVICLE EXCISION (Right: Shoulder)  Patient Location: PACU  Anesthesia Type:GA combined with regional for post-op pain  Level of Consciousness: drowsy and patient cooperative  Airway & Oxygen Therapy: Patient Spontanous Breathing and Patient connected to face mask oxygen  Post-op Assessment: Report given to RN and Post -op Vital signs reviewed and stable  Post vital signs: Reviewed and stable  Last Vitals:  Vitals Value Taken Time  BP    Temp    Pulse 74 06/09/23 1110  Resp 15 06/09/23 1110  SpO2 99 % 06/09/23 1110  Vitals shown include unfiled device data.  Last Pain:  Vitals:   06/09/23 0820  TempSrc: Temporal  PainSc: 0-No pain         Complications: No notable events documented.

## 2023-06-09 NOTE — Anesthesia Postprocedure Evaluation (Signed)
Anesthesia Post Note  Patient: Gloria Torres  Procedure(s) Performed: SHOULDER ARTHROSCOPY WITH SUBACROMIAL DECOMPRESSION AND BICEP TENDON REPAIR ROTATOR CUFF REPAIR (Right: Shoulder) SHOULDER ARTHROSCOPY WITH DISTAL CLAVICLE EXCISION (Right: Shoulder)     Patient location during evaluation: PACU Anesthesia Type: Regional and General Level of consciousness: awake Pain management: pain level controlled Vital Signs Assessment: post-procedure vital signs reviewed and stable Respiratory status: spontaneous breathing, nonlabored ventilation and respiratory function stable Cardiovascular status: blood pressure returned to baseline and stable Postop Assessment: no apparent nausea or vomiting Anesthetic complications: no   No notable events documented.  Last Vitals:  Vitals:   06/09/23 1139 06/09/23 1143  BP: (!) 110/58 123/63  Pulse: 84 82  Resp: (!) 21 17  Temp:  36.4 C  SpO2: 96% 98%    Last Pain:  Vitals:   06/09/23 1143  TempSrc:   PainSc: 0-No pain                 Linton Rump

## 2023-06-10 ENCOUNTER — Encounter (HOSPITAL_BASED_OUTPATIENT_CLINIC_OR_DEPARTMENT_OTHER): Payer: Self-pay | Admitting: Orthopaedic Surgery

## 2023-06-15 DIAGNOSIS — S46011D Strain of muscle(s) and tendon(s) of the rotator cuff of right shoulder, subsequent encounter: Secondary | ICD-10-CM | POA: Diagnosis not present

## 2023-06-21 DIAGNOSIS — S46011D Strain of muscle(s) and tendon(s) of the rotator cuff of right shoulder, subsequent encounter: Secondary | ICD-10-CM | POA: Diagnosis not present

## 2023-06-24 DIAGNOSIS — S46011D Strain of muscle(s) and tendon(s) of the rotator cuff of right shoulder, subsequent encounter: Secondary | ICD-10-CM | POA: Diagnosis not present

## 2023-06-28 DIAGNOSIS — S46011D Strain of muscle(s) and tendon(s) of the rotator cuff of right shoulder, subsequent encounter: Secondary | ICD-10-CM | POA: Diagnosis not present

## 2023-06-30 DIAGNOSIS — S46011D Strain of muscle(s) and tendon(s) of the rotator cuff of right shoulder, subsequent encounter: Secondary | ICD-10-CM | POA: Diagnosis not present

## 2023-07-05 DIAGNOSIS — S46011D Strain of muscle(s) and tendon(s) of the rotator cuff of right shoulder, subsequent encounter: Secondary | ICD-10-CM | POA: Diagnosis not present

## 2023-07-07 DIAGNOSIS — S46011D Strain of muscle(s) and tendon(s) of the rotator cuff of right shoulder, subsequent encounter: Secondary | ICD-10-CM | POA: Diagnosis not present

## 2023-07-12 DIAGNOSIS — S46011D Strain of muscle(s) and tendon(s) of the rotator cuff of right shoulder, subsequent encounter: Secondary | ICD-10-CM | POA: Diagnosis not present

## 2023-07-19 DIAGNOSIS — S46011D Strain of muscle(s) and tendon(s) of the rotator cuff of right shoulder, subsequent encounter: Secondary | ICD-10-CM | POA: Diagnosis not present

## 2023-07-21 DIAGNOSIS — S46011D Strain of muscle(s) and tendon(s) of the rotator cuff of right shoulder, subsequent encounter: Secondary | ICD-10-CM | POA: Diagnosis not present

## 2023-07-26 DIAGNOSIS — S46011D Strain of muscle(s) and tendon(s) of the rotator cuff of right shoulder, subsequent encounter: Secondary | ICD-10-CM | POA: Diagnosis not present

## 2023-07-28 DIAGNOSIS — S46011D Strain of muscle(s) and tendon(s) of the rotator cuff of right shoulder, subsequent encounter: Secondary | ICD-10-CM | POA: Diagnosis not present

## 2023-08-02 DIAGNOSIS — S46011D Strain of muscle(s) and tendon(s) of the rotator cuff of right shoulder, subsequent encounter: Secondary | ICD-10-CM | POA: Diagnosis not present

## 2023-08-04 DIAGNOSIS — S46011D Strain of muscle(s) and tendon(s) of the rotator cuff of right shoulder, subsequent encounter: Secondary | ICD-10-CM | POA: Diagnosis not present

## 2023-08-16 DIAGNOSIS — S46011D Strain of muscle(s) and tendon(s) of the rotator cuff of right shoulder, subsequent encounter: Secondary | ICD-10-CM | POA: Diagnosis not present

## 2023-08-18 DIAGNOSIS — S46011D Strain of muscle(s) and tendon(s) of the rotator cuff of right shoulder, subsequent encounter: Secondary | ICD-10-CM | POA: Diagnosis not present

## 2023-08-24 DIAGNOSIS — S46011D Strain of muscle(s) and tendon(s) of the rotator cuff of right shoulder, subsequent encounter: Secondary | ICD-10-CM | POA: Diagnosis not present

## 2023-08-26 DIAGNOSIS — S46011D Strain of muscle(s) and tendon(s) of the rotator cuff of right shoulder, subsequent encounter: Secondary | ICD-10-CM | POA: Diagnosis not present

## 2023-08-30 DIAGNOSIS — S46011D Strain of muscle(s) and tendon(s) of the rotator cuff of right shoulder, subsequent encounter: Secondary | ICD-10-CM | POA: Diagnosis not present

## 2023-09-01 DIAGNOSIS — S46011D Strain of muscle(s) and tendon(s) of the rotator cuff of right shoulder, subsequent encounter: Secondary | ICD-10-CM | POA: Diagnosis not present

## 2023-09-02 DIAGNOSIS — S46011D Strain of muscle(s) and tendon(s) of the rotator cuff of right shoulder, subsequent encounter: Secondary | ICD-10-CM | POA: Diagnosis not present

## 2023-09-06 DIAGNOSIS — S46011D Strain of muscle(s) and tendon(s) of the rotator cuff of right shoulder, subsequent encounter: Secondary | ICD-10-CM | POA: Diagnosis not present

## 2023-09-09 DIAGNOSIS — S46011D Strain of muscle(s) and tendon(s) of the rotator cuff of right shoulder, subsequent encounter: Secondary | ICD-10-CM | POA: Diagnosis not present

## 2023-09-13 DIAGNOSIS — S46011D Strain of muscle(s) and tendon(s) of the rotator cuff of right shoulder, subsequent encounter: Secondary | ICD-10-CM | POA: Diagnosis not present

## 2023-09-16 DIAGNOSIS — S46011D Strain of muscle(s) and tendon(s) of the rotator cuff of right shoulder, subsequent encounter: Secondary | ICD-10-CM | POA: Diagnosis not present

## 2023-09-23 DIAGNOSIS — S46011D Strain of muscle(s) and tendon(s) of the rotator cuff of right shoulder, subsequent encounter: Secondary | ICD-10-CM | POA: Diagnosis not present

## 2024-06-01 ENCOUNTER — Other Ambulatory Visit: Payer: Self-pay
